# Patient Record
Sex: Female | Born: 1960 | Race: Black or African American | Hispanic: No | Marital: Single | Smoking: Former smoker
Health system: Southern US, Community
[De-identification: ages and names within clinical notes are randomized; demographics above are authoritative.]

## PROBLEM LIST (undated history)

## (undated) DIAGNOSIS — Z90710 Acquired absence of both cervix and uterus: Secondary | ICD-10-CM

## (undated) DIAGNOSIS — G4700 Insomnia, unspecified: Secondary | ICD-10-CM

## (undated) DIAGNOSIS — J45909 Unspecified asthma, uncomplicated: Secondary | ICD-10-CM

## (undated) DIAGNOSIS — R002 Palpitations: Secondary | ICD-10-CM

## (undated) DIAGNOSIS — C539 Malignant neoplasm of cervix uteri, unspecified: Secondary | ICD-10-CM

## (undated) DIAGNOSIS — R202 Paresthesia of skin: Secondary | ICD-10-CM

## (undated) DIAGNOSIS — T7840XA Allergy, unspecified, initial encounter: Secondary | ICD-10-CM

## (undated) DIAGNOSIS — R5383 Other fatigue: Secondary | ICD-10-CM

## (undated) DIAGNOSIS — G4730 Sleep apnea, unspecified: Secondary | ICD-10-CM

## (undated) DIAGNOSIS — Z5189 Encounter for other specified aftercare: Secondary | ICD-10-CM

## (undated) DIAGNOSIS — I10 Essential (primary) hypertension: Secondary | ICD-10-CM

## (undated) HISTORY — DX: Other fatigue: R53.83

## (undated) HISTORY — PX: UTERINE FIBROID SURGERY: SHX826

## (undated) HISTORY — PX: INGUINAL HERNIA REPAIR: SUR1180

## (undated) HISTORY — PX: ABDOMINAL HYSTERECTOMY: SHX81

## (undated) HISTORY — PX: CERVICAL ABLATION: SHX5771

## (undated) HISTORY — DX: Malignant neoplasm of cervix uteri, unspecified: C53.9

## (undated) HISTORY — DX: Sleep apnea, unspecified: G47.30

## (undated) HISTORY — DX: Palpitations: R00.2

## (undated) HISTORY — DX: Encounter for other specified aftercare: Z51.89

## (undated) HISTORY — DX: Essential (primary) hypertension: I10

## (undated) HISTORY — DX: Insomnia, unspecified: G47.00

## (undated) HISTORY — DX: Paresthesia of skin: R20.2

## (undated) HISTORY — DX: Allergy, unspecified, initial encounter: T78.40XA

## (undated) HISTORY — DX: Acquired absence of both cervix and uterus: Z90.710

---

## 1997-10-10 ENCOUNTER — Other Ambulatory Visit: Admission: RE | Admit: 1997-10-10 | Discharge: 1997-10-10 | Payer: Self-pay | Admitting: Dermatology

## 1999-03-14 ENCOUNTER — Other Ambulatory Visit: Admission: RE | Admit: 1999-03-14 | Discharge: 1999-03-14 | Payer: Self-pay | Admitting: *Deleted

## 1999-10-21 ENCOUNTER — Encounter: Admission: RE | Admit: 1999-10-21 | Discharge: 1999-10-21 | Payer: Self-pay | Admitting: *Deleted

## 1999-10-21 ENCOUNTER — Encounter: Payer: Self-pay | Admitting: *Deleted

## 2000-06-14 ENCOUNTER — Other Ambulatory Visit: Admission: RE | Admit: 2000-06-14 | Discharge: 2000-06-14 | Payer: Self-pay | Admitting: *Deleted

## 2000-12-23 ENCOUNTER — Encounter: Payer: Self-pay | Admitting: *Deleted

## 2000-12-23 ENCOUNTER — Encounter: Admission: RE | Admit: 2000-12-23 | Discharge: 2000-12-23 | Payer: Self-pay | Admitting: *Deleted

## 2002-01-27 ENCOUNTER — Other Ambulatory Visit: Admission: RE | Admit: 2002-01-27 | Discharge: 2002-01-27 | Payer: Self-pay | Admitting: Family Medicine

## 2002-10-10 ENCOUNTER — Encounter: Payer: Self-pay | Admitting: Family Medicine

## 2002-10-10 ENCOUNTER — Encounter: Admission: RE | Admit: 2002-10-10 | Discharge: 2002-10-10 | Payer: Self-pay | Admitting: Family Medicine

## 2003-07-31 ENCOUNTER — Ambulatory Visit (HOSPITAL_COMMUNITY): Admission: RE | Admit: 2003-07-31 | Discharge: 2003-07-31 | Payer: Self-pay | Admitting: Family Medicine

## 2003-08-02 ENCOUNTER — Inpatient Hospital Stay (HOSPITAL_COMMUNITY): Admission: AD | Admit: 2003-08-02 | Discharge: 2003-08-02 | Payer: Self-pay | Admitting: Obstetrics and Gynecology

## 2003-08-08 ENCOUNTER — Inpatient Hospital Stay (HOSPITAL_COMMUNITY): Admission: AD | Admit: 2003-08-08 | Discharge: 2003-08-10 | Payer: Self-pay | Admitting: Obstetrics and Gynecology

## 2003-11-27 ENCOUNTER — Other Ambulatory Visit: Admission: RE | Admit: 2003-11-27 | Discharge: 2003-11-27 | Payer: Self-pay | Admitting: Obstetrics and Gynecology

## 2003-11-29 ENCOUNTER — Ambulatory Visit (HOSPITAL_COMMUNITY): Admission: RE | Admit: 2003-11-29 | Discharge: 2003-11-29 | Payer: Self-pay | Admitting: Obstetrics and Gynecology

## 2003-12-05 ENCOUNTER — Inpatient Hospital Stay (HOSPITAL_COMMUNITY): Admission: RE | Admit: 2003-12-05 | Discharge: 2003-12-08 | Payer: Self-pay | Admitting: Obstetrics and Gynecology

## 2003-12-14 ENCOUNTER — Inpatient Hospital Stay (HOSPITAL_COMMUNITY): Admission: AD | Admit: 2003-12-14 | Discharge: 2003-12-14 | Payer: Self-pay | Admitting: Obstetrics and Gynecology

## 2004-07-21 ENCOUNTER — Emergency Department (HOSPITAL_COMMUNITY): Admission: EM | Admit: 2004-07-21 | Discharge: 2004-07-21 | Payer: Self-pay | Admitting: Emergency Medicine

## 2005-03-03 ENCOUNTER — Other Ambulatory Visit: Admission: RE | Admit: 2005-03-03 | Discharge: 2005-03-03 | Payer: Self-pay | Admitting: Obstetrics and Gynecology

## 2005-04-17 ENCOUNTER — Encounter: Admission: RE | Admit: 2005-04-17 | Discharge: 2005-04-17 | Payer: Self-pay | Admitting: Obstetrics and Gynecology

## 2005-08-03 ENCOUNTER — Emergency Department (HOSPITAL_COMMUNITY): Admission: EM | Admit: 2005-08-03 | Discharge: 2005-08-03 | Payer: Self-pay | Admitting: Emergency Medicine

## 2005-08-06 ENCOUNTER — Encounter: Admission: RE | Admit: 2005-08-06 | Discharge: 2005-08-06 | Payer: Self-pay | Admitting: Family Medicine

## 2006-06-17 ENCOUNTER — Encounter: Admission: RE | Admit: 2006-06-17 | Discharge: 2006-06-17 | Payer: Self-pay | Admitting: Family Medicine

## 2010-11-13 ENCOUNTER — Other Ambulatory Visit (HOSPITAL_COMMUNITY): Payer: Self-pay | Admitting: Family Medicine

## 2010-11-13 DIAGNOSIS — Z780 Asymptomatic menopausal state: Secondary | ICD-10-CM

## 2010-11-13 DIAGNOSIS — Z1231 Encounter for screening mammogram for malignant neoplasm of breast: Secondary | ICD-10-CM

## 2010-11-28 ENCOUNTER — Ambulatory Visit (HOSPITAL_COMMUNITY)
Admission: RE | Admit: 2010-11-28 | Discharge: 2010-11-28 | Disposition: A | Discharge status: Home / Self Care | Payer: Self-pay | Source: Ambulatory Visit | Attending: Family Medicine | Admitting: Family Medicine

## 2010-11-28 DIAGNOSIS — Z1231 Encounter for screening mammogram for malignant neoplasm of breast: Secondary | ICD-10-CM | POA: Insufficient documentation

## 2010-11-28 DIAGNOSIS — Z780 Asymptomatic menopausal state: Secondary | ICD-10-CM

## 2010-11-28 DIAGNOSIS — Z13820 Encounter for screening for osteoporosis: Secondary | ICD-10-CM | POA: Insufficient documentation

## 2010-12-24 ENCOUNTER — Other Ambulatory Visit: Payer: Self-pay | Admitting: Family Medicine

## 2010-12-24 ENCOUNTER — Encounter: Payer: Self-pay | Admitting: Internal Medicine

## 2011-01-08 ENCOUNTER — Encounter: Payer: Self-pay | Admitting: Internal Medicine

## 2011-01-09 ENCOUNTER — Encounter: Payer: Self-pay | Admitting: Internal Medicine

## 2011-01-09 ENCOUNTER — Ambulatory Visit (INDEPENDENT_AMBULATORY_CARE_PROVIDER_SITE_OTHER): Payer: Self-pay | Admitting: Internal Medicine

## 2011-01-09 DIAGNOSIS — R9431 Abnormal electrocardiogram [ECG] [EKG]: Secondary | ICD-10-CM

## 2011-01-09 DIAGNOSIS — I10 Essential (primary) hypertension: Secondary | ICD-10-CM

## 2011-01-09 DIAGNOSIS — Z0000 Encounter for general adult medical examination without abnormal findings: Secondary | ICD-10-CM

## 2011-01-09 NOTE — Progress Notes (Signed)
HPI [facility].  Working on BP.  Referred for abnormal EKG No symptoms of chest pain.  Just started exercising.  Walking.  Has machine to work abs. Was swimming  All summer.No cutting back on work outs. Occasional palpitations.  Couple seconds.  No caffeine.  No dizziness. Smoking  Allergies  Allergen Reactions  . Influenza Virus Vaccine   . Penicillins     Current Outpatient Prescriptions  Medication Sig Dispense Refill  . metroNIDAZOLE (FLAGYL) 500 MG tablet Take 500 mg by mouth 3 (three) times daily.          Past Medical History  Diagnosis Date  . Chest pain   . Palpitations   . Insomnia   . Fatigue   . Paresthesias     lower extremities  . H/O: hysterectomy     Past Surgical History  Procedure Date  . Inguinal hernia repair   . Uterine fibroid surgery     Family History  Problem Relation Age of Onset  . Hypertension Mother   . Diabetes Mother   . Osteoarthritis Mother   . Stroke Maternal Grandmother     History   Social History  . Marital Status: Divorced    Spouse Name: N/A    Number of Children: N/A  . Years of Education: N/A   Occupational History  . Not on file.   Social History Main Topics  . Smoking status: Current Everyday Smoker -- 0.5 packs/day    Types: Cigarettes  . Smokeless tobacco: Not on file  . Alcohol Use: Yes     < 6 drinks weekly  . Drug Use: Not on file  . Sexually Active: Not on file   Other Topics Concern  . Not on file   Social History Narrative  . No narrative on file    Review of Systems:  All systems reviewed.  They are negative to the above problem except as previously stated.  Vital Signs: BP 121/83  Pulse 90  Ht 5\' 6"  (1.676 m)  Wt 175 lb (79.379 kg)  BMI 28.25 kg/m2  Physical Exam Patient is in NAD HEENT:  Normocephalic, atraumatic. EOMI, PERRLA.  Neck: JVP is normal. No thyromegaly. No bruits.  Lungs: clear to auscultation. No rales no wheezes.  Heart: Regular rate and rhythm.  Normal S1, S2. No S3.   No significant murmurs. PMI not displaced.  Abdomen:  Supple, nontender. Normal bowel sounds. No masses. No hepatomegaly.  Extremities:   Good distal pulses throughout. No lower extremity edema.  Musculoskeletal :moving all extremities.  Neuro:   alert and oriented x3.  CN II-XII grossly intact.  EKG:  ([facility], 12/24/10);  SR  Nonspecific ST T wave changes.   Assessment and Plan:

## 2011-01-12 DIAGNOSIS — R9431 Abnormal electrocardiogram [ECG] [EKG]: Secondary | ICD-10-CM | POA: Insufficient documentation

## 2011-01-12 DIAGNOSIS — Z0000 Encounter for general adult medical examination without abnormal findings: Secondary | ICD-10-CM | POA: Insufficient documentation

## 2011-01-12 DIAGNOSIS — I10 Essential (primary) hypertension: Secondary | ICD-10-CM | POA: Insufficient documentation

## 2011-01-12 NOTE — Assessment & Plan Note (Signed)
GOod control.

## 2011-01-12 NOTE — Assessment & Plan Note (Signed)
Counselled on tobacco.  Went over lipid goals.  She has levels checked at [facility].

## 2011-01-12 NOTE — Assessment & Plan Note (Signed)
I am not impressed with a signif abnormality on the EKG.  Patient denies symptoms.  EKG changes are nonspecific. I would not pursue any further work up.

## 2013-01-22 ENCOUNTER — Encounter (HOSPITAL_COMMUNITY): Payer: Self-pay | Admitting: Emergency Medicine

## 2013-01-22 ENCOUNTER — Emergency Department (INDEPENDENT_AMBULATORY_CARE_PROVIDER_SITE_OTHER)
Admission: EM | Admit: 2013-01-22 | Discharge: 2013-01-22 | Disposition: A | Discharge status: Home / Self Care | Payer: No Typology Code available for payment source | Source: Home / Self Care | Attending: Family Medicine | Admitting: Family Medicine

## 2013-01-22 DIAGNOSIS — M25569 Pain in unspecified knee: Secondary | ICD-10-CM

## 2013-01-22 DIAGNOSIS — M25561 Pain in right knee: Secondary | ICD-10-CM

## 2013-01-22 MED ORDER — METHYLPREDNISOLONE ACETATE 40 MG/ML IJ SUSP
INTRAMUSCULAR | Status: AC
  Filled 2013-01-22: qty 5

## 2013-01-22 MED ORDER — METHYLPREDNISOLONE ACETATE 40 MG/ML IJ SUSP
40.0000 mg | Freq: Once | INTRAMUSCULAR | Status: AC
  Administered 2013-01-22: 40 mg via INTRA_ARTICULAR

## 2013-01-22 MED ORDER — TRAMADOL HCL 50 MG PO TABS: 50.0000 mg | ORAL_TABLET | Freq: Four times a day (QID) | ORAL | Status: DC | PRN

## 2013-01-22 NOTE — ED Notes (Signed)
C/o bilateral knee pain States right knee is swollen and then the swelling will go to the other knee.   Denies any injury.

## 2013-01-22 NOTE — ED Provider Notes (Signed)
Karen Molina is a 52 y.o. female who presents to Urgent Care today for bilateral knee pain right worse than left. Patient has had knee pain off and on bilaterally for many years. Her pain is slowly worsening. She notes that her right knee pain is worsening now after doing more walking than usual on Halloween evening. She denies any injury locking or catching. She's taking ibuprofen which has helped a little. She denies any radiating pain weakness or numbness.   Past Medical History  Diagnosis Date  . Chest pain   . Palpitations   . Insomnia   . Fatigue   . Paresthesias     lower extremities  . H/O: hysterectomy    History  Substance Use Topics  . Smoking status: Current Every Day Smoker -- 0.50 packs/day    Types: Cigarettes  . Smokeless tobacco: Not on file  . Alcohol Use: Yes     Comment: < 6 drinks weekly   ROS as above Medications reviewed. No current facility-administered medications for this encounter.   Current Outpatient Prescriptions  Medication Sig Dispense Refill  . traMADol (ULTRAM) 50 MG tablet Take 1 tablet (50 mg total) by mouth every 6 (six) hours as needed for pain.  40 tablet  0    Exam:  BP 160/103  Pulse 88  Temp(Src) 97.8 F (36.6 C) (Oral)  Resp 18  SpO2 99% Gen: Well NAD KNEES BILATERALLY:  Right: No significant effusion. Mildly tender over the medial joint line. Range of motion 0-120 with 1+ retropatellar crepitations on extension. Stable ligamentous exam.  Left: No significant effusion. Mildly tender over the medial joint line. Range of motion 0-120 with 1+ retropatellar crepitations on extension. Stable ligamentous exam.  Nonedematous bilateral extremities with normal capillary refill and sensation.  Knee injection. Right Consent obtained and timeout performed. Patient seated in a comfortable position with legs hanging off the table.  The medial Peri-patellar tendon space was palpated and marked. The skin was then cleaned with  Alchol. Cold spray applied. A 25-gauge inch and a half needle was used to inject 40 mg of Depo-Medrol and 4 mL of 0.5% Marcaine. Patient tolerated procedure well no bleeding Pain improved following injection    No results found for this or any previous visit (from the past 24 hour(s)). No results found.  Assessment and Plan: 52 y.o. female with knee pain. Likely due to DJD. Plan to treat with corticosteroid injection and tramadol. If not improved followup with [facility] for ultimate referral to  [facility]. Discussed warning signs or symptoms. Please see discharge instructions. Patient expresses understanding.   Rodolph Bong, MD 01/22/13 1500

## 2013-01-31 ENCOUNTER — Ambulatory Visit: Payer: No Typology Code available for payment source | Attending: Internal Medicine

## 2013-02-14 ENCOUNTER — Ambulatory Visit: Payer: No Typology Code available for payment source | Attending: Internal Medicine | Admitting: Internal Medicine

## 2013-02-14 ENCOUNTER — Encounter: Payer: Self-pay | Admitting: Internal Medicine

## 2013-02-14 VITALS — BP 157/97 | HR 87 | Temp 98.2°F | Resp 16 | Ht 66.0 in | Wt 189.6 lb

## 2013-02-14 DIAGNOSIS — M25561 Pain in right knee: Secondary | ICD-10-CM

## 2013-02-14 DIAGNOSIS — R030 Elevated blood-pressure reading, without diagnosis of hypertension: Secondary | ICD-10-CM

## 2013-02-14 DIAGNOSIS — M25569 Pain in unspecified knee: Secondary | ICD-10-CM | POA: Insufficient documentation

## 2013-02-14 DIAGNOSIS — Z139 Encounter for screening, unspecified: Secondary | ICD-10-CM

## 2013-02-14 LAB — CBC WITH DIFFERENTIAL/PLATELET
Basophils Absolute: 0 10*3/uL (ref 0.0–0.1)
Eosinophils Relative: 1 % (ref 0–5)
HCT: 35.3 % — ABNORMAL LOW (ref 36.0–46.0)
Lymphocytes Relative: 35 % (ref 12–46)
Lymphs Abs: 2.2 10*3/uL (ref 0.7–4.0)
MCHC: 32.3 g/dL (ref 30.0–36.0)
Monocytes Absolute: 0.4 10*3/uL (ref 0.1–1.0)
Monocytes Relative: 6 % (ref 3–12)
Platelets: 280 10*3/uL (ref 150–400)
RDW: 17.1 % — ABNORMAL HIGH (ref 11.5–15.5)
WBC: 6.5 10*3/uL (ref 4.0–10.5)

## 2013-02-14 MED ORDER — IBUPROFEN 800 MG PO TABS: 800.0000 mg | ORAL_TABLET | Freq: Three times a day (TID) | ORAL | Status: DC | PRN

## 2013-02-14 NOTE — Patient Instructions (Signed)

## 2013-02-14 NOTE — Progress Notes (Signed)
Patient here for bilateral knee pain Recently seen at the urgent care and had  Cortisone shot to right knee

## 2013-02-14 NOTE — Progress Notes (Signed)
Patient ID: Karen Molina, female   DOB: 1961/01/09, 52 y.o.   MRN: [id number] MRN: [id number] Name: Karen Molina  Sex: female Age: 52 y.o. DOB: 18-Nov-1960  Allergies: Influenza virus vacc split pf and Penicillins  Chief Complaint  Patient presents with   Knee Pain    HPI: Patient is 52 y.o. female who comes for the first time to establish medical care, she reported to have bilateral knee pain, she went to the urgent care and was given a steroid injection in right knee , As per patient it is improved but still has more pain in the left knee, today her blood pressure is elevated, denies any headache dizziness chest pain or shortness of breath.   Past Medical History  Diagnosis Date   Chest pain    Palpitations    Insomnia    Fatigue    Paresthesias     lower extremities   H/O: hysterectomy     Past Surgical History  Procedure Laterality Date   Inguinal hernia repair     Uterine fibroid surgery     Abdominal hysterectomy        Medication List       This list is accurate as of: 02/14/13  5:00 PM.  Always use your most recent med list.               ibuprofen 800 MG tablet  Commonly known as:  ADVIL,MOTRIN  Take 1 tablet (800 mg total) by mouth every 8 (eight) hours as needed.        Meds ordered this encounter  Medications   ibuprofen (ADVIL,MOTRIN) 800 MG tablet    Sig: Take 1 tablet (800 mg total) by mouth every 8 (eight) hours as needed.    Dispense:  30 tablet    Refill:  1     There is no immunization history on file for this patient.  History  Substance Use Topics   Smoking status: Never Smoker    Smokeless tobacco: Not on file   Alcohol Use: Yes     Comment: < 6 drinks weekly    Review of Systems  As noted in HPI  Filed Vitals:   02/14/13 1632  BP: 157/97  Pulse: 87  Temp: 98.2 F (36.8 C)  Resp: 16    Physical Exam  Physical Exam  Constitutional: She is oriented to person, place, and time. No distress.  Eyes:  EOM are normal. Pupils are equal, round, and reactive to light.  Cardiovascular: Normal rate and regular rhythm.   Pulmonary/Chest: Breath sounds normal. No respiratory distress. She has no wheezes. She has no rales.  Musculoskeletal:  Left knee tenderness on the medial side   Neurological: She is alert and oriented to person, place, and time.    CBC No results found for this basename: wbc, rbc, hgb, hct, plt, mcv, neutrabs, lymphsabs, monoabs, eosabs, basosabs    CMP  No results found for this basename: na, k, cl, co2, glucose, bun, creatinine, calcium, prot, albumin, ast, alt, alkphos, bilitot, gfrnonaa, gfraa    No results found for this basename: chol, tri, ldl    No components found with this basename: hga1c    No results found for this basename: AST    Assessment and Plan  Elevated BP  Advised for low salt diet and exercise   Bilateral knee pain - Plan: ibuprofen (ADVIL,MOTRIN) 800 MG tablet, Ambulatory referral to Orthopedic Surgery  Screening - Plan: CBC with Differential, COMPLETE METABOLIC PANEL WITH  GFR, TSH, Lipid panel, Vit D  25 hydroxy (rtn osteoporosis monitoring), Ambulatory referral to Gynecology   Return in about 6 weeks (around 03/28/2013).  Doris Cheadle, MD

## 2013-02-15 LAB — LIPID PANEL
Cholesterol: 207 mg/dL — ABNORMAL HIGH (ref 0–200)
HDL: 46 mg/dL (ref >39)
Total CHOL/HDL Ratio: 4.5 Ratio
Triglycerides: 218 mg/dL — ABNORMAL HIGH (ref <150)

## 2013-02-15 LAB — COMPLETE METABOLIC PANEL WITH GFR
ALT: 19 U/L (ref 0–35)
AST: 18 U/L (ref 0–37)
Creat: 0.71 mg/dL (ref 0.50–1.10)
GFR, Est African American: >89 mL/min
GFR, Est Non African American: >89 mL/min
Potassium: 4.3 mEq/L (ref 3.5–5.3)

## 2013-02-15 LAB — VITAMIN D 25 HYDROXY (VIT D DEFICIENCY, FRACTURES): Vit D, 25-Hydroxy: 11 ng/mL — ABNORMAL LOW (ref 30–89)

## 2013-03-20 ENCOUNTER — Ambulatory Visit (INDEPENDENT_AMBULATORY_CARE_PROVIDER_SITE_OTHER): Payer: No Typology Code available for payment source | Admitting: Sports Medicine

## 2013-03-20 ENCOUNTER — Encounter: Payer: Self-pay | Admitting: Sports Medicine

## 2013-03-20 VITALS — BP 144/84 | HR 89 | Ht 66.0 in | Wt 189.0 lb

## 2013-03-20 DIAGNOSIS — M25569 Pain in unspecified knee: Secondary | ICD-10-CM

## 2013-03-20 DIAGNOSIS — M25562 Pain in left knee: Secondary | ICD-10-CM

## 2013-03-20 MED ORDER — METHYLPREDNISOLONE ACETATE 40 MG/ML IJ SUSP
40.0000 mg | Freq: Once | INTRAMUSCULAR | Status: AC
  Administered 2013-03-20: 40 mg via INTRA_ARTICULAR

## 2013-03-20 NOTE — Progress Notes (Signed)
Subjective:    Patient ID: Karen Molina, female    DOB: 1960/03/31, 52 y.o.   MRN: [id number]  HPI 52 year old female with HTN presents for evaluation of Bilateral Knee pain.  Patient has had bilateral knee pain for approximately one year.  This began after she started walking on a daily basis.  She reports that the pain is located diffusely over her knee.  No focal area of tenderness.  She reports that the pain is intermittent and worsens with physical activity and prolonged standing.  She also notes that she has frequent popping/clicking with motion.    She has been seen for this on a few prior occasions.  In November of 2014 she was evaluated at urgent care by Dr. Denyse Amass.  Pain was thought to be secondary to DJD and patient received a steroid injection of the right knee with dramatic improvement.  She is also seeing her private care physician and has been taking PRN ibuprofen for pain. It has helped but  has increased her blood pressure and she was forced to stop. Tramadol was also prescribed which was ineffective.  Recently she's been taking Tylenol with minimal improvement in her pain.  History reviewed. See below.  Past Medical History  Diagnosis Date  . Chest pain   . Palpitations   . Insomnia   . Fatigue   . Paresthesias     lower extremities  . H/O: hysterectomy    Past Surgical History  Procedure Laterality Date  . Inguinal hernia repair    . Uterine fibroid surgery    . Abdominal hysterectomy     Patient Active Problem List   Diagnosis Date Noted  . Abnormal EKG 01/12/2011  . Hypertension 01/12/2011  . Healthcare maintenance 01/12/2011   History   Social History  . Marital Status: Divorced    Spouse Name: N/A    Number of Children: N/A  . Years of Education: N/A   Social History Main Topics  . Smoking status: Never Smoker   . Smokeless tobacco: None  . Alcohol Use: Yes     Comment: < 6 drinks weekly  . Drug Use: No  . Sexual Activity: None   Other  Topics Concern  . None   Social History Narrative  . None   Review of Systems Per HPI with the following additions: No recent fall, trauma.      Objective:   Physical Exam Filed Vitals:   03/20/13 1543  BP: 144/84  Pulse: 89  General: Well-appearing African female in no acute distress. Knee: Inspection: Normal (right knee). Small effusion of the left knee noted.  Palpation: normal with no warmth, joint line tenderness, patellar tenderness, or condyle tenderness. ROM full in flexion and extension and lower leg rotation. Crepitus noted with seated flexion and extension. Ligaments with solid consistent endpoints including ACL, PCL, LCL, MCL. Negative Mcmurray's.   Non painful patellar compression. Patellar and quadriceps tendons unremarkable. Hamstring and quadriceps strength is normal.  Normal Gait.  Procedure: Left knee injection Consent signed and scanned into record. Medication:  1 cc Solumedrol  3cc Lidocaine 1% Preparation: area cleansed with alcohol x 3 Time Out taken  Injection  Landmarks identified About medication injected using an anterior medial approach Patient tolerated well without bleeding or paresthesias  Patient had good range of motion of joint after injection     Assessment & Plan:  Bilateral knee pain  - Right knee pain now resolved following recent steroid injection. - Patient with  significant left knee pain and crepitus.  This is likely secondary to DJD. - Given elevated BP with NSAIDs and improvement with prior injection, patient was given L knee injection today.  Patient tolerated procedure well. - Patient to follow up as needed.  If fails to improve will need standing xray's to evaluate degree of DJD.  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 I will give her some soft green sports insoles to try her work shoes.

## 2013-03-28 ENCOUNTER — Ambulatory Visit: Payer: No Typology Code available for payment source | Admitting: Internal Medicine

## 2013-03-28 ENCOUNTER — Telehealth: Payer: Self-pay

## 2013-04-17 ENCOUNTER — Encounter: Payer: No Typology Code available for payment source | Admitting: Obstetrics & Gynecology

## 2013-05-08 ENCOUNTER — Ambulatory Visit: Payer: No Typology Code available for payment source | Admitting: Family Medicine

## 2013-05-16 ENCOUNTER — Ambulatory Visit: Payer: No Typology Code available for payment source | Admitting: Sports Medicine

## 2013-05-29 ENCOUNTER — Ambulatory Visit: Payer: No Typology Code available for payment source | Admitting: Sports Medicine

## 2013-07-05 ENCOUNTER — Encounter: Payer: Self-pay | Admitting: Obstetrics & Gynecology

## 2013-07-05 ENCOUNTER — Ambulatory Visit (INDEPENDENT_AMBULATORY_CARE_PROVIDER_SITE_OTHER): Payer: No Typology Code available for payment source | Admitting: Obstetrics & Gynecology

## 2013-07-05 VITALS — BP 131/91 | HR 87 | Temp 97.6°F | Ht 67.0 in | Wt 190.6 lb

## 2013-07-05 DIAGNOSIS — N951 Menopausal and female climacteric states: Secondary | ICD-10-CM

## 2013-07-05 DIAGNOSIS — Z0000 Encounter for general adult medical examination without abnormal findings: Secondary | ICD-10-CM

## 2013-07-05 LAB — COMPREHENSIVE METABOLIC PANEL
ALBUMIN: 4.5 g/dL (ref 3.5–5.2)
ALT: 21 U/L (ref 0–35)
AST: 19 U/L (ref 0–37)
Alkaline Phosphatase: 52 U/L (ref 39–117)
BUN: 16 mg/dL (ref 6–23)
CALCIUM: 9.8 mg/dL (ref 8.4–10.5)
CHLORIDE: 103 meq/L (ref 96–112)
CO2: 27 meq/L (ref 19–32)
Creat: 0.71 mg/dL (ref 0.50–1.10)
GLUCOSE: 104 mg/dL — AB (ref 70–99)
POTASSIUM: 4.5 meq/L (ref 3.5–5.3)
Sodium: 137 mEq/L (ref 135–145)
Total Bilirubin: 0.5 mg/dL (ref 0.2–1.2)
Total Protein: 7.3 g/dL (ref 6.0–8.3)

## 2013-07-05 LAB — LIPID PANEL
CHOLESTEROL: 244 mg/dL — AB (ref 0–200)
HDL: 46 mg/dL (ref >39)
LDL Cholesterol: 158 mg/dL — ABNORMAL HIGH (ref 0–99)
TRIGLYCERIDES: 200 mg/dL — AB (ref <150)
Total CHOL/HDL Ratio: 5.3 Ratio
VLDL: 40 mg/dL (ref 0–40)

## 2013-07-05 LAB — CBC
HEMATOCRIT: 37.4 % (ref 36.0–46.0)
HEMOGLOBIN: 12.3 g/dL (ref 12.0–15.0)
MCH: 23.5 pg — ABNORMAL LOW (ref 26.0–34.0)
MCHC: 32.9 g/dL (ref 30.0–36.0)
MCV: 71.5 fL — ABNORMAL LOW (ref 78.0–100.0)
Platelets: 303 10*3/uL (ref 150–400)
RBC: 5.23 MIL/uL — ABNORMAL HIGH (ref 3.87–5.11)
RDW: 17.6 % — AB (ref 11.5–15.5)
WBC: 6.6 10*3/uL (ref 4.0–10.5)

## 2013-07-05 MED ORDER — ESTROGENS CONJ SYNTHETIC B 1.25 MG PO TABS: 1.2500 mg | ORAL_TABLET | Freq: Every day | ORAL | Status: DC

## 2013-07-05 NOTE — Progress Notes (Signed)
Subjective:    Karen Molina is a 53 y.o. female who presents for an annual exam. The patient has no complaints today. She has hot flashes and night sweats for 9 years. She has mood changes and wants treatment. The patient is sexually active. GYN screening history: last pap: was normal. The patient wears seatbelts: yes. The patient participates in regular exercise: yes. Has the patient ever been transfused or tattooed?: no. The patient reports that there is not domestic violence in her life.   Menstrual History: OB History   Grav Para Term Preterm Abortions TAB SAB Ect Mult Living   3 3        2       Menarche age: 45  No LMP recorded. Patient has had a hysterectomy.    The following portions of the patient's history were reviewed and updated as appropriate: allergies, current medications, past family history, past medical history, past social history, past surgical history and problem list.  Review of Systems Pertinent items are noted in HPI.  Monogamous for 5 years. 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 Denies dyspareunia, buts need lubrication with sex   Objective:    BP 131/91  Pulse 87  Temp(Src) 97.6 F (36.4 C)  Ht 5\' 7"  (1.702 m)  Wt 190 lb 9.6 oz (86.456 kg)  BMI 29.85 kg/m2  General Appearance:    Alert, cooperative, no distress, appears stated age  Head:    Normocephalic, without obvious abnormality, atraumatic  Eyes:    PERRL, conjunctiva/corneas clear, EOM's intact, fundi    benign, both eyes  Ears:    Normal TM's and external ear canals, both ears  Nose:   Nares normal, septum midline, mucosa normal, no drainage    or sinus tenderness  Throat:   Lips, mucosa, and tongue normal; teeth and gums normal  Neck:   Supple, symmetrical, trachea midline, no adenopathy;    thyroid:  no enlargement/tenderness/nodules; no carotid   bruit or JVD  Back:     Symmetric, no curvature, ROM normal, no CVA tenderness  Lungs:     Clear to auscultation  bilaterally, respirations unlabored  Chest Wall:    No tenderness or deformity   Heart:    Regular rate and rhythm, S1 and S2 normal, no murmur, rub   or gallop  Breast Exam:    No tenderness, masses, or nipple abnormality  Abdomen:     Soft, non-tender, bowel sounds active all four quadrants,    no masses, no organomegaly  Genitalia:    Normal female without lesion, discharge or tenderness, normal bimanual exam     Extremities:   Extremities normal, atraumatic, no cyanosis or edema  Pulses:   2+ and symmetric all extremities  Skin:   Skin color, texture, turgor normal, no rashes or lesions  Lymph nodes:   Cervical, supraclavicular, and axillary nodes normal  Neurologic:   CNII-XII intact, normal strength, sensation and reflexes    throughout  .    Assessment:    Healthy female exam.  Menopausal symptoms definitely affecting her life   Plan:     Breast self exam technique reviewed and patient encouraged to perform self-exam monthly. Mammogram.  Start 1.25 mg estrogen daily RTC 1 month Fasting labs today

## 2013-07-06 ENCOUNTER — Telehealth: Payer: Self-pay

## 2013-07-06 MED ORDER — ESTRADIOL 1 MG PO TABS: 1.0000 mg | ORAL_TABLET | Freq: Every day | ORAL | Status: DC

## 2013-07-06 NOTE — Telephone Encounter (Signed)
Estrace 1mg  PO daily prescribed after speaking to Dr. Florencia Reasons about medication options for generic estrogens. [place] who stated medication would be OK as it will be $4. Medication will be ready for patient tomorrow.

## 2013-07-06 NOTE — Telephone Encounter (Signed)
Message copied by Geanie Logan on Thu Jul 06, 2013  1:28 PM ------      Message from: Clovia Cuff C      Created: Thu Jul 06, 2013 10:25 AM       Any of the generic estrogens are appropriate. I thought that I had written generic. Premarin is fine also. I am thinking that at least one of the generic oral estrogens is on the $4 list at [facility] or [place]. If you get me the list, I can show you which one is fine.      Thanks      ----- Message -----         From: Geanie Logan, RN         Sent: 07/06/2013   9:47 AM           To: Emily Filbert, MD            Hi Dr. Hulan Fray,             [facility] called and said they and pt. Cannot afford the Enjuvia and would like to know if they can prescribe Premarin instead (however, pt. Will not be able to get the Premarin for a Month because they need to have pt. Apply and if accepted will get Premarin for free). Other option: is there another medication comparable that is much cheaper. Enjuvia is $250 and pt. Has [place] card.             Please let us know so I can call the pharmacy.             Thanks so much,       Lovena Le, Therapist, sports        ------

## 2013-07-06 NOTE — Telephone Encounter (Signed)
Pt's [facility] called [phone]) and stated the Enjuvia prescribed is $250-- they would like to know if pt. Can take Premarin (though it will be up to a month until pt. Can get it because there is an application process and pt. Will only get it for free if approved). If not, Dr. Hulan Fray needs to recommend another medication. Message sent to Dr. Hulan Fray regarding options, awaiting response.

## 2013-07-17 ENCOUNTER — Telehealth: Payer: Self-pay

## 2013-07-17 ENCOUNTER — Encounter: Payer: Self-pay | Admitting: *Deleted

## 2013-07-17 NOTE — Telephone Encounter (Signed)
Called pt and left message to give [place] a call as needed or in one year to schedule an annual exam.

## 2013-07-17 NOTE — Telephone Encounter (Signed)
Message copied by Michel Harrow on Mon Jul 17, 2013  3:58 PM ------      Message from: Emily Filbert      Created: Mon Jul 17, 2013  3:45 PM       Just in a year for an annual ------

## 2013-07-17 NOTE — Telephone Encounter (Signed)
Opened in Error.

## 2013-08-04 ENCOUNTER — Other Ambulatory Visit: Payer: Self-pay | Admitting: Obstetrics & Gynecology

## 2013-08-04 DIAGNOSIS — Z1231 Encounter for screening mammogram for malignant neoplasm of breast: Secondary | ICD-10-CM

## 2013-08-09 ENCOUNTER — Ambulatory Visit: Payer: Self-pay | Admitting: Obstetrics & Gynecology

## 2013-08-09 ENCOUNTER — Ambulatory Visit (HOSPITAL_COMMUNITY): Payer: Self-pay

## 2013-10-25 ENCOUNTER — Ambulatory Visit (HOSPITAL_COMMUNITY)
Admission: RE | Admit: 2013-10-25 | Discharge: 2013-10-25 | Disposition: A | Discharge status: Home / Self Care | Payer: BC Managed Care – PPO | Source: Ambulatory Visit | Attending: Obstetrics & Gynecology | Admitting: Obstetrics & Gynecology

## 2013-10-25 DIAGNOSIS — Z1231 Encounter for screening mammogram for malignant neoplasm of breast: Secondary | ICD-10-CM

## 2014-01-22 ENCOUNTER — Encounter: Payer: Self-pay | Admitting: Obstetrics & Gynecology

## 2014-07-02 ENCOUNTER — Encounter (HOSPITAL_COMMUNITY): Payer: Self-pay | Admitting: *Deleted

## 2014-07-02 ENCOUNTER — Emergency Department (HOSPITAL_COMMUNITY)
Admission: EM | Admit: 2014-07-02 | Discharge: 2014-07-02 | Disposition: A | Discharge status: Hospice | Payer: Managed Care, Other (non HMO) | Source: Home / Self Care | Attending: Emergency Medicine | Admitting: Emergency Medicine

## 2014-07-02 ENCOUNTER — Emergency Department (HOSPITAL_COMMUNITY)
Admission: EM | Admit: 2014-07-02 | Discharge: 2014-07-02 | Disposition: A | Discharge status: Home / Self Care | Payer: Managed Care, Other (non HMO) | Attending: Emergency Medicine | Admitting: Emergency Medicine

## 2014-07-02 ENCOUNTER — Encounter (HOSPITAL_COMMUNITY): Payer: Self-pay | Admitting: General Practice

## 2014-07-02 ENCOUNTER — Emergency Department (INDEPENDENT_AMBULATORY_CARE_PROVIDER_SITE_OTHER): Payer: Managed Care, Other (non HMO)

## 2014-07-02 DIAGNOSIS — J111 Influenza due to unidentified influenza virus with other respiratory manifestations: Secondary | ICD-10-CM | POA: Diagnosis not present

## 2014-07-02 DIAGNOSIS — Z8669 Personal history of other diseases of the nervous system and sense organs: Secondary | ICD-10-CM | POA: Insufficient documentation

## 2014-07-02 DIAGNOSIS — R9431 Abnormal electrocardiogram [ECG] [EKG]: Secondary | ICD-10-CM

## 2014-07-02 DIAGNOSIS — J45909 Unspecified asthma, uncomplicated: Secondary | ICD-10-CM | POA: Insufficient documentation

## 2014-07-02 DIAGNOSIS — Z880 Allergy status to penicillin: Secondary | ICD-10-CM | POA: Insufficient documentation

## 2014-07-02 DIAGNOSIS — Z90710 Acquired absence of both cervix and uterus: Secondary | ICD-10-CM | POA: Insufficient documentation

## 2014-07-02 DIAGNOSIS — Z793 Long term (current) use of hormonal contraceptives: Secondary | ICD-10-CM | POA: Diagnosis not present

## 2014-07-02 DIAGNOSIS — E876 Hypokalemia: Secondary | ICD-10-CM | POA: Insufficient documentation

## 2014-07-02 DIAGNOSIS — R05 Cough: Secondary | ICD-10-CM | POA: Diagnosis present

## 2014-07-02 HISTORY — DX: Unspecified asthma, uncomplicated: J45.909

## 2014-07-02 LAB — CBC WITH DIFFERENTIAL/PLATELET
BASOS ABS: 0 10*3/uL (ref 0.0–0.1)
Basophils Relative: 0 % (ref 0–1)
Eosinophils Absolute: 0 10*3/uL (ref 0.0–0.7)
Eosinophils Relative: 0 % (ref 0–5)
HEMATOCRIT: 37.8 % (ref 36.0–46.0)
Hemoglobin: 12.3 g/dL (ref 12.0–15.0)
LYMPHS PCT: 29 % (ref 12–46)
Lymphs Abs: 1.2 10*3/uL (ref 0.7–4.0)
MCH: 23.2 pg — ABNORMAL LOW (ref 26.0–34.0)
MCHC: 32.5 g/dL (ref 30.0–36.0)
MCV: 71.2 fL — ABNORMAL LOW (ref 78.0–100.0)
MONO ABS: 0.5 10*3/uL (ref 0.1–1.0)
Monocytes Relative: 13 % — ABNORMAL HIGH (ref 3–12)
NEUTROS ABS: 2.4 10*3/uL (ref 1.7–7.7)
Neutrophils Relative %: 58 % (ref 43–77)
PLATELETS: 258 10*3/uL (ref 150–400)
RBC: 5.31 MIL/uL — ABNORMAL HIGH (ref 3.87–5.11)
RDW: 15.6 % — ABNORMAL HIGH (ref 11.5–15.5)
WBC: 4.1 10*3/uL (ref 4.0–10.5)

## 2014-07-02 LAB — COMPREHENSIVE METABOLIC PANEL
ALBUMIN: 3.8 g/dL (ref 3.5–5.2)
ALT: 18 U/L (ref 0–35)
ANION GAP: 12 (ref 5–15)
AST: 26 U/L (ref 0–37)
Alkaline Phosphatase: 43 U/L (ref 39–117)
BUN: 9 mg/dL (ref 6–23)
CALCIUM: 8.5 mg/dL (ref 8.4–10.5)
CO2: 24 mmol/L (ref 19–32)
CREATININE: 0.89 mg/dL (ref 0.50–1.10)
Chloride: 99 mmol/L (ref 96–112)
GFR calc Af Amer: 84 mL/min — ABNORMAL LOW (ref >90)
GFR calc non Af Amer: 72 mL/min — ABNORMAL LOW (ref >90)
Glucose, Bld: 119 mg/dL — ABNORMAL HIGH (ref 70–99)
Potassium: 2.8 mmol/L — ABNORMAL LOW (ref 3.5–5.1)
Sodium: 135 mmol/L (ref 135–145)
TOTAL PROTEIN: 7.2 g/dL (ref 6.0–8.3)
Total Bilirubin: 0.4 mg/dL (ref 0.3–1.2)

## 2014-07-02 LAB — TROPONIN I

## 2014-07-02 LAB — D-DIMER, QUANTITATIVE: D-Dimer, Quant: 0.45 ug/mL-FEU (ref 0.00–0.48)

## 2014-07-02 MED ORDER — PREDNISONE 20 MG PO TABS: 20.0000 mg | ORAL_TABLET | Freq: Two times a day (BID) | ORAL | Status: DC

## 2014-07-02 MED ORDER — IBUPROFEN 800 MG PO TABS
800.0000 mg | ORAL_TABLET | Freq: Once | ORAL | Status: AC
  Administered 2014-07-02: 800 mg via ORAL

## 2014-07-02 MED ORDER — IPRATROPIUM-ALBUTEROL 0.5-2.5 (3) MG/3ML IN SOLN
RESPIRATORY_TRACT | Status: AC
  Filled 2014-07-02: qty 3

## 2014-07-02 MED ORDER — POTASSIUM CHLORIDE CRYS ER 20 MEQ PO TBCR
40.0000 meq | EXTENDED_RELEASE_TABLET | Freq: Once | ORAL | Status: AC
  Administered 2014-07-02: 40 meq via ORAL
  Filled 2014-07-02: qty 2

## 2014-07-02 MED ORDER — SODIUM CHLORIDE 0.9 % IV SOLN
Freq: Once | INTRAVENOUS | Status: AC
  Administered 2014-07-02: 15:00:00 via INTRAVENOUS

## 2014-07-02 MED ORDER — IBUPROFEN 800 MG PO TABS
ORAL_TABLET | ORAL | Status: AC
  Filled 2014-07-02: qty 1

## 2014-07-02 MED ORDER — HYDROCODONE-HOMATROPINE 5-1.5 MG/5ML PO SYRP: 5.0000 mL | ORAL_SOLUTION | Freq: Four times a day (QID) | ORAL | Status: DC | PRN

## 2014-07-02 MED ORDER — ALBUTEROL SULFATE (2.5 MG/3ML) 0.083% IN NEBU
10.0000 mg | INHALATION_SOLUTION | Freq: Once | RESPIRATORY_TRACT | Status: AC
  Administered 2014-07-02: 10 mg via RESPIRATORY_TRACT
  Filled 2014-07-02: qty 12

## 2014-07-02 MED ORDER — IPRATROPIUM-ALBUTEROL 0.5-2.5 (3) MG/3ML IN SOLN
3.0000 mL | Freq: Once | RESPIRATORY_TRACT | Status: AC
  Administered 2014-07-02: 3 mL via RESPIRATORY_TRACT

## 2014-07-02 MED ORDER — ALBUTEROL SULFATE HFA 108 (90 BASE) MCG/ACT IN AERS
1.0000 | INHALATION_SPRAY | Freq: Four times a day (QID) | RESPIRATORY_TRACT | Status: DC | PRN
  Administered 2014-07-02: 1 via RESPIRATORY_TRACT
  Filled 2014-07-02: qty 6.7

## 2014-07-02 NOTE — ED Provider Notes (Signed)
CSN: [id number]     Arrival date & time 07/02/14  1229 History   First MD Initiated Contact with Patient 07/02/14 1313     Chief Complaint  Patient presents with  . Cough   (Consider location/radiation/quality/duration/timing/severity/associated sxs/prior Treatment) HPI  She is a 54 year old woman here for evaluation of cough. Her symptoms started 2 days ago with fevers, body aches, headache, cough. She has developed some throat pain associated with coughing. She also reports chest pain that is worse with coughing. She has had vomiting and diarrhea. Fever at home has been up to 101.7. She reports some chronic sinus issues, but no acute worsening.  She took Tylenol this morning for her fever.  She reports a history of asthma, but has not had an inhaler in several years.  Past Medical History  Diagnosis Date  . Chest pain   . Palpitations   . Insomnia   . Fatigue   . Paresthesias     lower extremities  . H/O: hysterectomy   . Asthma    Past Surgical History  Procedure Laterality Date  . Inguinal hernia repair    . Uterine fibroid surgery    . Abdominal hysterectomy     Family History  Problem Relation Age of Onset  . Hypertension Mother   . Diabetes Mother   . Osteoarthritis Mother   . Stroke Maternal Grandmother    History  Substance Use Topics  . Smoking status: Never Smoker   . Smokeless tobacco: Not on file  . Alcohol Use: Yes     Comment: < 6 drinks weekly   OB History    Gravida Para Term Preterm AB TAB SAB Ectopic Multiple Living   3 3        2      Review of Systems  Constitutional: Positive for fever, activity change and appetite change.  HENT: Positive for sore throat. Negative for congestion, ear pain, rhinorrhea and trouble swallowing.   Respiratory: Positive for cough and wheezing. Negative for shortness of breath.   Cardiovascular: Positive for chest pain.  Gastrointestinal: Positive for vomiting and diarrhea.  Musculoskeletal: Positive for myalgias.   Neurological: Positive for headaches.    Allergies  Influenza virus vacc split pf and Penicillins  Home Medications   Prior to Admission medications   Medication Sig Start Date End Date Taking? Authorizing Provider  acetaminophen (TYLENOL) 325 MG tablet Take 650 mg by mouth every 6 (six) hours as needed for fever.   Yes Historical Provider, MD  estradiol (ESTRACE) 1 MG tablet Take 1 tablet (1 mg total) by mouth daily. 07/06/13  Yes Emily Filbert, MD  ibuprofen (ADVIL,MOTRIN) 800 MG tablet Take 1 tablet (800 mg total) by mouth every 8 (eight) hours as needed. 02/14/13  Yes Lorayne Marek, MD  estrogens conjugated, synthetic B, (ENJUVIA) 1.25 MG tablet Take 1 tablet (1.25 mg total) by mouth daily. 07/05/13   Emily Filbert, MD   BP 165/97 mmHg  Pulse 96  Temp(Src) 98.9 F (37.2 C) (Oral)  Resp 18  SpO2 100% Physical Exam  Constitutional: She is oriented to person, place, and time. She appears well-developed and well-nourished. She appears distressed (appears ill).  HENT:  Head: Normocephalic and atraumatic.  Mouth/Throat: Oropharynx is clear and moist.  Neck: Neck supple.  Cardiovascular: Normal rate, regular rhythm and normal heart sounds.   No murmur heard. Pulmonary/Chest: Effort normal. No respiratory distress. She has wheezes (Scattered expiratory). She has no rales. She exhibits tenderness (Along both sternal borders).  Abdominal: Soft. Bowel sounds are normal. She exhibits no distension. There is tenderness (Mild diffuse). There is no rebound and no guarding.  Lymphadenopathy:    She has no cervical adenopathy.  Neurological: She is alert and oriented to person, place, and time.    ED Course  Procedures (including critical care time) ED ECG REPORT   Date: 07/02/2014  Rate: 100  Rhythm: normal sinus rhythm  QRS Axis: normal  Intervals: normal  ST/T Wave abnormalities: t-wave inversions in inferior leads and V2-V5  Conduction Disutrbances:none  Narrative Interpretation:  NSR with new t-wave inversions  Old EKG Reviewed: changes noted new T-wave inversions  I have personally reviewed the EKG tracing and agree with the computerized printout as noted.  Labs Review Labs Reviewed - No data to display  Imaging Review Dg Chest 2 View  07/02/2014   CLINICAL DATA:  Fever and cough for 3 days  EXAM: CHEST  2 VIEW  COMPARISON:  Jul 21, 2004 chest radiograph ; chest CT Jul 21, 2004  FINDINGS: Lungs are clear. Heart size and pulmonary vascularity are normal. No adenopathy. No bone lesions.  IMPRESSION: No edema or consolidation.   Electronically Signed   By: Lowella Grip III M.D.   On: 07/02/2014 14:26     MDM   1. Influenza   2. T wave inversion in EKG    Ibuprofen 800 mg by mouth given. DuoNeb 0.5-2.5 mg given.  Wheezing is resolved after DuoNeb. She likely has the flu. With associated EKG changes, will transfer to the emergency room for additional evaluation.  Melony Overly, MD 07/02/14 [phone]

## 2014-07-02 NOTE — ED Notes (Signed)
Patient states understanding for medications and follow up care. Demonstration technique was utilized for the inhaler. Spouse was at bedside and very involved with teach back method.

## 2014-07-02 NOTE — Discharge Instructions (Signed)
It is important that he follow up with your primary care doctor regarding her abnormal EKG today. Return to the ER if any worsening of symptoms.  Influenza Influenza ("the flu") is a viral infection of the respiratory tract. It occurs more often in winter months because people spend more time in close contact with one another. Influenza can make you feel very sick. Influenza easily spreads from person to person (contagious). CAUSES  Influenza is caused by a virus that infects the respiratory tract. You can catch the virus by breathing in droplets from an infected person's cough or sneeze. You can also catch the virus by touching something that was recently contaminated with the virus and then touching your mouth, nose, or eyes. RISKS AND COMPLICATIONS You may be at risk for a more severe case of influenza if you smoke cigarettes, have diabetes, have chronic heart disease (such as heart failure) or lung disease (such as asthma), or if you have a weakened immune system. Elderly people and pregnant women are also at risk for more serious infections. The most common problem of influenza is a lung infection (pneumonia). Sometimes, this problem can require emergency medical care and may be life threatening. SIGNS AND SYMPTOMS  Symptoms typically last 4 to 10 days and may include:  Fever.  Chills.  Headache, body aches, and muscle aches.  Sore throat.  Chest discomfort and cough.  Poor appetite.  Weakness or feeling tired.  Dizziness.  Nausea or vomiting. DIAGNOSIS  Diagnosis of influenza is often made based on your history and a physical exam. A nose or throat swab test can be done to confirm the diagnosis. TREATMENT  In mild cases, influenza goes away on its own. Treatment is directed at relieving symptoms. For more severe cases, your health care provider may prescribe antiviral medicines to shorten the sickness. Antibiotic medicines are not effective because the infection is caused by a  virus, not by bacteria. HOME CARE INSTRUCTIONS  Take medicines only as directed by your health care provider.  Use a cool mist humidifier to make breathing easier.  Get plenty of rest until your temperature returns to normal. This usually takes 3 to 4 days.  Drink enough fluid to keep your urine clear or pale yellow.  Cover yourmouth and nosewhen coughing or sneezing,and wash your handswellto prevent thevirusfrom spreading.  Stay homefromwork orschool untilthe fever is gonefor at least 24full day. PREVENTION  An annual influenza vaccination (flu shot) is the best way to avoid getting influenza. An annual flu shot is now routinely recommended for all adults in the [place] IF:  You experiencechest pain, yourcough worsens,or you producemore mucus.  Youhave nausea,vomiting, ordiarrhea.  Your fever returns or gets worse. SEEK IMMEDIATE MEDICAL CARE IF:  You havetrouble breathing, you become short of breath,or your skin ornails becomebluish.  You have severe painor stiffnessin the neck.  You develop a sudden headache, or pain in the face or ear.  You have nausea or vomiting that you cannot control. MAKE SURE YOU:   Understand these instructions.  Will watch your condition.  Will get help right away if you are not doing well or get worse. Document Released: 03/06/2000 Document Revised: 07/24/2013 Document Reviewed: 06/08/2011 [facility] Patient Information 2015 [place], [place]. This information is not intended to replace advice given to you by your health care provider. Make sure you discuss any questions you have with your health care provider.  Hypokalemia Hypokalemia means that the amount of potassium in the blood is lower  than normal.Potassium is a chemical, called an electrolyte, that helps regulate the amount of fluid in the body. It also stimulates muscle contraction and helps nerves function properly.Most of the body's potassium is  inside of cells, and only a very small amount is in the blood. Because the amount in the blood is so small, minor changes can be life-threatening. CAUSES  Antibiotics.  Diarrhea or vomiting.  Using laxatives too much, which can cause diarrhea.  Chronic kidney disease.  Water pills (diuretics).  Eating disorders (bulimia).  Low magnesium level.  Sweating a lot. SIGNS AND SYMPTOMS  Weakness.  Constipation.  Fatigue.  Muscle cramps.  Mental confusion.  Skipped heartbeats or irregular heartbeat (palpitations).  Tingling or numbness. DIAGNOSIS  Your health care provider can diagnose hypokalemia with blood tests. In addition to checking your potassium level, your health care provider may also check other lab tests. TREATMENT Hypokalemia can be treated with potassium supplements taken by mouth or adjustments in your current medicines. If your potassium level is very low, you may need to get potassium through a vein (IV) and be monitored in the hospital. A diet high in potassium is also helpful. Foods high in potassium are:  Nuts, such as peanuts and pistachios.  Seeds, such as sunflower seeds and pumpkin seeds.  Peas, lentils, and lima beans.  Whole grain and bran cereals and breads.  Fresh fruit and vegetables, such as apricots, avocado, bananas, cantaloupe, kiwi, oranges, tomatoes, asparagus, and potatoes.  Orange and tomato juices.  Red meats.  Fruit yogurt. HOME CARE INSTRUCTIONS  Take all medicines as prescribed by your health care provider.  Maintain a healthy diet by including nutritious food, such as fruits, vegetables, nuts, whole grains, and lean meats.  If you are taking a laxative, be sure to follow the directions on the label. SEEK MEDICAL CARE IF:  Your weakness gets worse.  You feel your heart pounding or racing.  You are vomiting or having diarrhea.  You are diabetic and having trouble keeping your blood glucose in the normal range. SEEK  IMMEDIATE MEDICAL CARE IF:  You have chest pain, shortness of breath, or dizziness.  You are vomiting or having diarrhea for more than 2 days.  You faint. MAKE SURE YOU:   Understand these instructions.  Will watch your condition.  Will get help right away if you are not doing well or get worse. Document Released: 03/09/2005 Document Revised: 12/28/2012 Document Reviewed: 09/09/2012 [facility] Patient Information 2015 [place], [place]. This information is not intended to replace advice given to you by your health care provider. Make sure you discuss any questions you have with your health care provider.  Chest Wall Pain Chest wall pain is pain in or around the bones and muscles of your chest. It may take up to 6 weeks to get better. It may take longer if you must stay physically active in your work and activities.  CAUSES  Chest wall pain may happen on its own. However, it may be caused by:  A viral illness like the flu.  Injury.  Coughing.  Exercise.  Arthritis.  Fibromyalgia.  Shingles. HOME CARE INSTRUCTIONS   Avoid overtiring physical activity. Try not to strain or perform activities that cause pain. This includes any activities using your chest or your abdominal and side muscles, especially if heavy weights are used.  Put ice on the sore area.  Put ice in a plastic bag.  Place a towel between your skin and the bag.  Leave the ice  on for 15-20 minutes per hour while awake for the first 2 days.  Only take over-the-counter or prescription medicines for pain, discomfort, or fever as directed by your caregiver. SEEK IMMEDIATE MEDICAL CARE IF:   Your pain increases, or you are very uncomfortable.  You have a fever.  Your chest pain becomes worse.  You have new, unexplained symptoms.  You have nausea or vomiting.  You feel sweaty or lightheaded.  You have a cough with phlegm (sputum), or you cough up blood. MAKE SURE YOU:   Understand these  instructions.  Will watch your condition.  Will get help right away if you are not doing well or get worse. Document Released: 03/09/2005 Document Revised: 06/01/2011 Document Reviewed: 11/03/2010 [facility] Patient Information 2015 [place], [place]. This information is not intended to replace advice given to you by your health care provider. Make sure you discuss any questions you have with your health care provider.

## 2014-07-02 NOTE — ED Notes (Signed)
Pt transferred from urgent care to ED. Pt complaining of fever, chills, sore throat, cough, headache, N/V and loose stools for the past three days. Pt was given a breathing treatment and 800 mg of  ibruprofen. Pt was sent to ED for a T wave inversion on EKG. V/S 130/90, HR 112, SPO2 98 on RA.

## 2014-07-02 NOTE — ED Provider Notes (Signed)
  Physical Exam  BP 133/78 mmHg  Pulse 101  Temp(Src) 98.6 F (37 C) (Oral)  Resp 18  SpO2 97%  Physical Exam  Constitutional: She is oriented to person, place, and time. She appears well-developed and well-nourished. No distress.  HENT:  Head: Normocephalic and atraumatic.  Mouth/Throat: Oropharynx is clear and moist. No oropharyngeal exudate.  Eyes: Right eye exhibits no discharge. Left eye exhibits no discharge. No scleral icterus.  Neck: Normal range of motion.  Cardiovascular: Normal rate, regular rhythm and normal heart sounds.   No murmur heard. Pulmonary/Chest: Effort normal and breath sounds normal. No respiratory distress.  Abdominal: Soft. There is no tenderness.  Musculoskeletal: Normal range of motion. She exhibits no edema or tenderness.  Neurological: She is alert and oriented to person, place, and time. No cranial nerve deficit. Coordination normal.  Skin: Skin is warm and dry. No rash noted. She is not diaphoretic.  Psychiatric: She has a normal mood and affect.    ED Course  Procedures  MDM Patient signed out to me by Alyse Low, PA-C with plan to follow-up on delta troponin and discharge home if second troponin negative on therapy with prednisone and albuterol inhaler for symptomatic therapy.  Delta troponin negative. On reassessment patient is well-appearing, nontoxic tachycardic, nontachypneic, non-hypoxic, and in no acute distress. Lung exam is clear. Patient given albuterol inhaler, and discharged to follow-up with her primary care physician. Patient educated on her hypokalemia. It is strongly encouraged patient follow up with her primary care doctor within the next week regarding the EKG changes found today as well as her current symptoms to ensure resolution. Discussed return precautions with patient, and patient verbalizes understanding and agreement of this plan. I encouraged patient to call or return to the ER with any worsening of symptoms or should she  have any questions or concerns.  BP 133/78 mmHg  Pulse 101  Temp(Src) 98.6 F (37 C) (Oral)  Resp 18  SpO2 97%  Signed,  Dahlia Bailiff, PA-C 11:41 PM       Dahlia Bailiff, PA-C 07/02/14 [id number]  Daleen Bo, MD 07/03/14 [phone]

## 2014-07-02 NOTE — ED Provider Notes (Signed)
CSN: [id number]     Arrival date & time 07/02/14  1527 History   None    No chief complaint on file.    (Consider location/radiation/quality/duration/timing/severity/associated sxs/prior Treatment) Patient is a 54 y.o. female presenting with cough. The history is provided by the patient. No language interpreter was used.  Cough Cough characteristics:  Non-productive Severity:  Moderate Onset quality:  Gradual Duration:  3 days Timing:  Constant Progression:  Worsening Chronicity:  New Smoker: no   Context: upper respiratory infection   Relieved by:  Nothing Worsened by:  Nothing tried Ineffective treatments:  None tried Associated symptoms: chest pain, chills, myalgias, shortness of breath and wheezing     Past Medical History  Diagnosis Date  . Chest pain   . Palpitations   . Insomnia   . Fatigue   . Paresthesias     lower extremities  . H/O: hysterectomy   . Asthma    Past Surgical History  Procedure Laterality Date  . Inguinal hernia repair    . Uterine fibroid surgery    . Abdominal hysterectomy     Family History  Problem Relation Age of Onset  . Hypertension Mother   . Diabetes Mother   . Osteoarthritis Mother   . Stroke Maternal Grandmother    History  Substance Use Topics  . Smoking status: Never Smoker   . Smokeless tobacco: Not on file  . Alcohol Use: Yes     Comment: < 6 drinks weekly   OB History    Gravida Para Term Preterm AB TAB SAB Ectopic Multiple Living   3 3        2      Review of Systems  Constitutional: Positive for chills.  Respiratory: Positive for cough, shortness of breath and wheezing.   Cardiovascular: Positive for chest pain.  Musculoskeletal: Positive for myalgias.  All other systems reviewed and are negative.     Allergies  Influenza virus vacc split pf and Penicillins  Home Medications   Prior to Admission medications   Medication Sig Start Date End Date Taking? Authorizing Provider  acetaminophen (TYLENOL)  325 MG tablet Take 650 mg by mouth every 6 (six) hours as needed for fever.    Historical Provider, MD  estradiol (ESTRACE) 1 MG tablet Take 1 tablet (1 mg total) by mouth daily. 07/06/13   Emily Filbert, MD  estrogens conjugated, synthetic B, (ENJUVIA) 1.25 MG tablet Take 1 tablet (1.25 mg total) by mouth daily. 07/05/13   Emily Filbert, MD  ibuprofen (ADVIL,MOTRIN) 800 MG tablet Take 1 tablet (800 mg total) by mouth every 8 (eight) hours as needed. 02/14/13   Lorayne Marek, MD   There were no vitals taken for this visit. Physical Exam  Constitutional: She is oriented to person, place, and time. She appears well-developed and well-nourished.  HENT:  Head: Normocephalic.  Right Ear: External ear normal.  Left Ear: External ear normal.  Nose: Nose normal.  Mouth/Throat: Oropharynx is clear and moist.  Eyes: Conjunctivae and EOM are normal. Pupils are equal, round, and reactive to light.  Neck: Normal range of motion.  Cardiovascular: Normal rate and normal heart sounds.   Pulmonary/Chest: Effort normal.  Abdominal: Soft. She exhibits no distension.  Musculoskeletal: Normal range of motion.  Neurological: She is alert and oriented to person, place, and time.  Psychiatric: She has a normal mood and affect.  Nursing note and vitals reviewed.   ED Course  Procedures (including critical care time) Labs Review Labs Reviewed -  No data to display  Imaging Review Dg Chest 2 View  07/02/2014   CLINICAL DATA:  Fever and cough for 3 days  EXAM: CHEST  2 VIEW  COMPARISON:  Jul 21, 2004 chest radiograph ; chest CT Jul 21, 2004  FINDINGS: Lungs are clear. Heart size and pulmonary vascularity are normal. No adenopathy. No bone lesions.  IMPRESSION: No edema or consolidation.   Electronically Signed   By: Lowella Grip III M.D.   On: 07/02/2014 14:26     EKG Interpretation   Date/Time:  Monday July 02 2014 15:38:58 EDT Ventricular Rate:  109 PR Interval:  178 QRS Duration: 82 QT Interval:   331 QTC Calculation: 446 R Axis:   85 Text Interpretation:  Sinus tachycardia Right atrial enlargement  Borderline T abnormalities, anterior leads Confirmed by Hazle Coca [phone])  on 07/02/2014 4:01:42 PM     Results for orders placed or performed during the hospital encounter of 07/02/14  Troponin I  Result Value Ref Range   Troponin I <0.03 <0.031 ng/mL  CBC with Differential/Platelet  Result Value Ref Range   WBC 4.1 4.0 - 10.5 K/uL   RBC 5.31 (H) 3.87 - 5.11 MIL/uL   Hemoglobin 12.3 12.0 - 15.0 g/dL   HCT 37.8 36.0 - 46.0 %   MCV 71.2 (L) 78.0 - 100.0 fL   MCH 23.2 (L) 26.0 - 34.0 pg   MCHC 32.5 30.0 - 36.0 g/dL   RDW 15.6 (H) 11.5 - 15.5 %   Platelets 258 150 - 400 K/uL   Neutrophils Relative % 58 43 - 77 %   Neutro Abs 2.4 1.7 - 7.7 K/uL   Lymphocytes Relative 29 12 - 46 %   Lymphs Abs 1.2 0.7 - 4.0 K/uL   Monocytes Relative 13 (H) 3 - 12 %   Monocytes Absolute 0.5 0.1 - 1.0 K/uL   Eosinophils Relative 0 0 - 5 %   Eosinophils Absolute 0.0 0.0 - 0.7 K/uL   Basophils Relative 0 0 - 1 %   Basophils Absolute 0.0 0.0 - 0.1 K/uL  Comprehensive metabolic panel  Result Value Ref Range   Sodium 135 135 - 145 mmol/L   Potassium 2.8 (L) 3.5 - 5.1 mmol/L   Chloride 99 96 - 112 mmol/L   CO2 24 19 - 32 mmol/L   Glucose, Bld 119 (H) 70 - 99 mg/dL   BUN 9 6 - 23 mg/dL   Creatinine, Ser 0.89 0.50 - 1.10 mg/dL   Calcium 8.5 8.4 - 10.5 mg/dL   Total Protein 7.2 6.0 - 8.3 g/dL   Albumin 3.8 3.5 - 5.2 g/dL   AST 26 0 - 37 U/L   ALT 18 0 - 35 U/L   Alkaline Phosphatase 43 39 - 117 U/L   Total Bilirubin 0.4 0.3 - 1.2 mg/dL   GFR calc non Af Amer 72 (L) >90 mL/min   GFR calc Af Amer 84 (L) >90 mL/min   Anion gap 12 5 - 15  D-dimer, quantitative  Result Value Ref Range   D-Dimer, Quant 0.45 0.00 - 0.48 ug/mL-FEU   Dg Chest 2 View  07/02/2014   CLINICAL DATA:  Fever and cough for 3 days  EXAM: CHEST  2 VIEW  COMPARISON:  Jul 21, 2004 chest radiograph ; chest CT Jul 21, 2004   FINDINGS: Lungs are clear. Heart size and pulmonary vascularity are normal. No adenopathy. No bone lesions.  IMPRESSION: No edema or consolidation.   Electronically Signed   By:  Lowella Grip III M.D.   On: 07/02/2014 14:26    MDM    Final diagnoses:  None    Pt's care turned over to Livingston Wheeler, PA-C 07/02/14 [place], MD 07/03/14 0700

## 2014-07-02 NOTE — ED Notes (Addendum)
3rd day of myalgias, fever--up to 101.7--chills, productive cough, headache  and sore throat  Also 6 loose stools the past 2 days

## 2014-08-10 ENCOUNTER — Ambulatory Visit: Payer: Self-pay | Admitting: Internal Medicine

## 2014-09-14 ENCOUNTER — Ambulatory Visit: Payer: Self-pay | Admitting: Internal Medicine

## 2014-10-05 ENCOUNTER — Encounter: Payer: Self-pay | Admitting: Internal Medicine

## 2014-10-05 ENCOUNTER — Ambulatory Visit (INDEPENDENT_AMBULATORY_CARE_PROVIDER_SITE_OTHER): Payer: Managed Care, Other (non HMO) | Admitting: Internal Medicine

## 2014-10-05 VITALS — BP 120/82 | HR 75 | Temp 98.0°F | Resp 18 | Ht 67.0 in | Wt 175.4 lb

## 2014-10-05 DIAGNOSIS — J45909 Unspecified asthma, uncomplicated: Secondary | ICD-10-CM | POA: Insufficient documentation

## 2014-10-05 DIAGNOSIS — R5382 Chronic fatigue, unspecified: Secondary | ICD-10-CM

## 2014-10-05 DIAGNOSIS — M25561 Pain in right knee: Secondary | ICD-10-CM

## 2014-10-05 DIAGNOSIS — M25562 Pain in left knee: Secondary | ICD-10-CM

## 2014-10-05 DIAGNOSIS — N951 Menopausal and female climacteric states: Secondary | ICD-10-CM

## 2014-10-05 DIAGNOSIS — R5383 Other fatigue: Secondary | ICD-10-CM | POA: Insufficient documentation

## 2014-10-05 DIAGNOSIS — Z8541 Personal history of malignant neoplasm of cervix uteri: Secondary | ICD-10-CM | POA: Diagnosis not present

## 2014-10-05 MED ORDER — IBUPROFEN 800 MG PO TABS: 800.0000 mg | ORAL_TABLET | Freq: Three times a day (TID) | ORAL | Status: DC | PRN

## 2014-10-05 NOTE — Progress Notes (Signed)
Patient ID: Karen Molina, female   DOB: 1961-02-06, 54 y.o.   MRN: [id number]    Location:    [facility]   Place of Service:   OFFICE   Chief Complaint  Patient presents with  . Establish Care    HTN    HPI:  54 yo female seen today as a new pt. She would like to discuss hot flashes despite taking HRT. Last pap smear 11/2010. She was without medical insurance and has been using the [facility] for care. She has a hx TAH/BSO. She has vaginal dryness  She is c/a asthma sx's. She was tx for influenza in Apr 2016 and wheezing noted on exam. She states she "weaned" herself off her HFA several yrs ago. She reports occasional chest tightness with SOB at rest x 1 yr. No CP. No DOE. Her CXR from 4/16 ED visit revealed no acute changes  She does not walk due to knee pain and swelling. She rec'd knee injections in the past which helped. She also has left lower back pain that worsens with walking and improves with lying down and sitting. 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 Chest pain   . Palpitations   . Insomnia   . Fatigue   . Paresthesias     lower extremities  . H/O: hysterectomy   . Asthma     Past Surgical History  Procedure Laterality Date  . Inguinal hernia repair    . Uterine fibroid surgery    . Abdominal hysterectomy    . Cervical ablation      Patient Care Team: Lorayne Marek, MD as PCP - General (Internal Medicine)  History   Social History  . Marital Status: Divorced    Spouse Name: N/A  . Number of Children: N/A  . Years of Education: N/A   Occupational History  . Not on file.   Social History Main Topics  . Smoking status: Former Smoker -- 0.50 packs/day    Types: Cigarettes  . Smokeless tobacco: Never Used  . Alcohol Use: 2.4 oz/week    4 Cans of beer per week     Comment: < 6 drinks weekly  . Drug Use: No  . Sexual Activity: Not on file   Other Topics Concern  . Not on  file   Social History Narrative   Diet: Regular        Do you drink/ eat things with caffeine? No      Marital status:    Single                           What year were you married ?      Do you live in a house, apartment,assistred living, condo, trailer, etc.)? House      Is it one or more stories? 2      How many persons live in your home ? 2      Do you have any pets in your home ?(please list) No      Current or past profession: Quality Auditor       Do you exercise? Yes                             Type & how often: Walk      Do you have a living will?  No      Do you have a DNR form?  No                     If not, do you want to discuss one? Yes      Do you have signed POA?HPOA forms?  No               If so, please bring to your        appointment           reports that she has quit smoking. Her smoking use included Cigarettes. She smoked 0.50 packs per day. She has never used smokeless tobacco. She reports that she drinks about 2.4 oz of alcohol per week. She reports that she does not use illicit drugs.  Family History  Problem Relation Age of Onset  . Hypertension Mother   . Diabetes Mother   . Osteoarthritis Mother   . Stroke Maternal Grandmother   . Diabetes Brother   . Hypertension Brother   . HIV/AIDS Brother   . Drug abuse Brother   . Lupus Sister   . Breast cancer Daughter   . Arthritis Sister    Family Status  Relation Status Death Age  . Mother Alive   . Maternal Grandmother Deceased   . Father Alive   . Brother Alive   . Brother Deceased   . Brother Alive   . Brother Alive   . Sister Alive   . Daughter Alive   . Son Alive      There is no immunization history on file for this patient.  Allergies  Allergen Reactions  . Influenza Virus Vacc Split Pf Other (See Comments)    "deadly sick "  . Penicillins Hives, Itching and Swelling    Medications: Patient's Medications  New Prescriptions   No medications on file  Previous  Medications   ACETAMINOPHEN (TYLENOL) 325 MG TABLET    Take 650 mg by mouth every 6 (six) hours as needed for fever.   ESTRADIOL (ESTRACE) 1 MG TABLET    Take 1 tablet (1 mg total) by mouth daily.   ESTROGENS CONJUGATED, SYNTHETIC B, (ENJUVIA) 1.25 MG TABLET    Take 1 tablet (1.25 mg total) by mouth daily.   HYDROCODONE-HOMATROPINE (HYCODAN) 5-1.5 MG/5ML SYRUP    Take 5 mLs by mouth every 6 (six) hours as needed for cough.   IBUPROFEN (ADVIL,MOTRIN) 800 MG TABLET    Take 1 tablet (800 mg total) by mouth every 8 (eight) hours as needed.   PREDNISONE (DELTASONE) 20 MG TABLET    Take 1 tablet (20 mg total) by mouth 2 (two) times daily with a meal.  Modified Medications   No medications on file  Discontinued Medications   No medications on file    Review of Systems  Constitutional: Positive for diaphoresis and fatigue. Negative for fever, chills, activity change and appetite change.  HENT: Positive for dental problem, hearing loss and sinus pressure. Negative for ear pain and sore throat.   Eyes: Negative for visual disturbance.  Respiratory: Positive for cough and wheezing. Negative for chest tightness and shortness of breath.   Cardiovascular: Negative for chest pain, palpitations and leg swelling.  Gastrointestinal: Negative for nausea, vomiting, abdominal pain, diarrhea, constipation and blood in stool.  Endocrine:       Hot flashes  Genitourinary: Positive for frequency. Negative for dysuria.  Musculoskeletal: Positive for back pain, joint swelling and arthralgias.  Allergic/Immunologic: Positive for environmental  allergies.  Neurological: Negative for dizziness, tremors, numbness and headaches.  Hematological: Positive for adenopathy.  Psychiatric/Behavioral: Positive for sleep disturbance, dysphoric mood and agitation. The patient is not nervous/anxious.     Filed Vitals:   10/05/14 1323  BP: 120/82  Pulse: 75  Temp: 98 F (36.7 C)  TempSrc: Oral  Resp: 18  Height: $Remove'5\' 7"'fJncpdc$   (1.702 m)  Weight: 175 lb 6.4 oz (79.561 kg)  SpO2: 95%   Body mass index is 27.47 kg/(m^2).  Physical Exam  Constitutional: She is oriented to person, place, and time. She appears well-developed and well-nourished. No distress.  Flat affect in NAD  HENT:  Mouth/Throat: Oropharynx is clear and moist. No oropharyngeal exudate.  Eyes: Pupils are equal, round, and reactive to light. No scleral icterus.  Neck: Neck supple. Carotid bruit is not present. No tracheal deviation present. No thyromegaly present.  Cardiovascular: Normal rate, regular rhythm, normal heart sounds and intact distal pulses.  Exam reveals no gallop and no friction rub.   No murmur heard. No LE edema b/l. no calf TTP.   Pulmonary/Chest: Effort normal and breath sounds normal. No stridor. No respiratory distress. She has no wheezes. She has no rales.  Abdominal: Soft. Bowel sounds are normal. She exhibits no distension (epigastric and suprapubic) and no mass. There is no hepatomegaly. There is tenderness. There is no rebound and no guarding.  Musculoskeletal: She exhibits edema and tenderness.       Back:  Short left leg; right pelvic outflare; right SI joint restriction; sacral torsion; right knee swelling with reduced ROM and crepitus on ROM; neg drawer test on right; neg SLR; no increased lordosis  Lymphadenopathy:    She has no cervical adenopathy.  Neurological: She is alert and oriented to person, place, and time.  Skin: Skin is warm and dry. No rash noted.  Psychiatric: Her behavior is normal. Thought content normal. Her affect is blunt.     Labs reviewed: No visits with results within 3 Month(s) from this visit. Latest known visit with results is:  Admission on 07/02/2014, Discharged on 07/02/2014  Component Date Value Ref Range Status  . Troponin I 07/02/2014 <0.03  <0.031 ng/mL Final   Comment:        NO INDICATION OF MYOCARDIAL INJURY.   . WBC 07/02/2014 4.1  4.0 - 10.5 K/uL Final  . RBC 07/02/2014  5.31* 3.87 - 5.11 MIL/uL Final  . Hemoglobin 07/02/2014 12.3  12.0 - 15.0 g/dL Final  . HCT 07/02/2014 37.8  36.0 - 46.0 % Final  . MCV 07/02/2014 71.2* 78.0 - 100.0 fL Final  . MCH 07/02/2014 23.2* 26.0 - 34.0 pg Final  . MCHC 07/02/2014 32.5  30.0 - 36.0 g/dL Final  . RDW 07/02/2014 15.6* 11.5 - 15.5 % Final  . Platelets 07/02/2014 258  150 - 400 K/uL Final  . Neutrophils Relative % 07/02/2014 58  43 - 77 % Final  . Neutro Abs 07/02/2014 2.4  1.7 - 7.7 K/uL Final  . Lymphocytes Relative 07/02/2014 29  12 - 46 % Final  . Lymphs Abs 07/02/2014 1.2  0.7 - 4.0 K/uL Final  . Monocytes Relative 07/02/2014 13* 3 - 12 % Final  . Monocytes Absolute 07/02/2014 0.5  0.1 - 1.0 K/uL Final  . Eosinophils Relative 07/02/2014 0  0 - 5 % Final  . Eosinophils Absolute 07/02/2014 0.0  0.0 - 0.7 K/uL Final  . Basophils Relative 07/02/2014 0  0 - 1 % Final  . Basophils Absolute 07/02/2014  0.0  0.0 - 0.1 K/uL Final  . Sodium 07/02/2014 135  135 - 145 mmol/L Final  . Potassium 07/02/2014 2.8* 3.5 - 5.1 mmol/L Final  . Chloride 07/02/2014 99  96 - 112 mmol/L Final  . CO2 07/02/2014 24  19 - 32 mmol/L Final  . Glucose, Bld 07/02/2014 119* 70 - 99 mg/dL Final  . BUN 07/02/2014 9  6 - 23 mg/dL Final  . Creatinine, Ser 07/02/2014 0.89  0.50 - 1.10 mg/dL Final  . Calcium 07/02/2014 8.5  8.4 - 10.5 mg/dL Final  . Total Protein 07/02/2014 7.2  6.0 - 8.3 g/dL Final  . Albumin 07/02/2014 3.8  3.5 - 5.2 g/dL Final  . AST 07/02/2014 26  0 - 37 U/L Final  . ALT 07/02/2014 18  0 - 35 U/L Final  . Alkaline Phosphatase 07/02/2014 43  39 - 117 U/L Final  . Total Bilirubin 07/02/2014 0.4  0.3 - 1.2 mg/dL Final  . GFR calc non Af Amer 07/02/2014 72* >90 mL/min Final  . GFR calc Af Amer 07/02/2014 84* >90 mL/min Final   Comment: (NOTE) The eGFR has been calculated using the CKD EPI equation. This calculation has not been validated in all clinical situations. eGFR's persistently <90 mL/min signify possible Chronic  Kidney Disease.   . Anion gap 07/02/2014 12  5 - 15 Final  . D-Dimer, Quant 07/02/2014 0.45  0.00 - 0.48 ug/mL-FEU Final   Comment:        AT THE INHOUSE ESTABLISHED CUTOFF VALUE OF 0.48 ug/mL FEU, THIS ASSAY HAS BEEN DOCUMENTED IN THE LITERATURE TO HAVE A SENSITIVITY AND NEGATIVE PREDICTIVE VALUE OF AT LEAST 98 TO 99%.  THE TEST RESULT SHOULD BE CORRELATED WITH AN ASSESSMENT OF THE CLINICAL PROBABILITY OF DVT / VTE.   [name] Troponin I 07/02/2014 <0.03  <0.031 ng/mL Final   Comment:        NO INDICATION OF MYOCARDIAL INJURY.     No results found.   Assessment/Plan   ICD-9-CM ICD-10-CM   1. Bilateral knee pain  R>L 719.46 M25.561 DG Knee Complete 4 Views Right    M25.562 ibuprofen (ADVIL,MOTRIN) 800 MG tablet     DISCONTINUED: ibuprofen (ADVIL,MOTRIN) 800 MG tablet  2. Extrinsic asthma, unspecified asthma severity, uncomplicated - severity TBD 493.00 J45.909 Pulmonary function test  3. Chronic fatigue - etiology unknown 780.79 R53.82 CMP     CBC with Differential     Vitamin D, 25-hydroxy     TSH     Urinalysis with Reflex Microscopic  4. Hx of cervical cancer s/p ablation V10.41 Z85.41   5. Hot flashes, menopausal 627.2 N95.1     --if labs ok, will consider changing HRT to vaginal cream  --Follow up in 1 month for CPE  [facility] S. Perlie Gold  [facility] [address] [place], [place] [address] [phone] Cell (Monday-Friday 8 AM - 5 PM) [phone] After 5 PM and follow prompts

## 2014-10-05 NOTE — Patient Instructions (Signed)
Follow up in 1 month for CPE  Will call with results.   Will call with imaging appt

## 2014-10-06 LAB — COMPREHENSIVE METABOLIC PANEL
A/G RATIO: 1.6 (ref 1.1–2.5)
ALT: 17 IU/L (ref 0–32)
AST: 17 IU/L (ref 0–40)
Albumin: 4.3 g/dL (ref 3.5–5.5)
Alkaline Phosphatase: 44 IU/L (ref 39–117)
BUN/Creatinine Ratio: 21 (ref 9–23)
BUN: 15 mg/dL (ref 6–24)
Bilirubin Total: 0.3 mg/dL (ref 0.0–1.2)
CALCIUM: 9.5 mg/dL (ref 8.7–10.2)
CO2: 22 mmol/L (ref 18–29)
Chloride: 101 mmol/L (ref 97–108)
Creatinine, Ser: 0.7 mg/dL (ref 0.57–1.00)
GFR calc Af Amer: 114 mL/min/{1.73_m2} (ref >59)
GFR, EST NON AFRICAN AMERICAN: 99 mL/min/{1.73_m2} (ref >59)
GLUCOSE: 88 mg/dL (ref 65–99)
Globulin, Total: 2.7 g/dL (ref 1.5–4.5)
POTASSIUM: 4.1 mmol/L (ref 3.5–5.2)
Sodium: 140 mmol/L (ref 134–144)
TOTAL PROTEIN: 7 g/dL (ref 6.0–8.5)

## 2014-10-06 LAB — CBC WITH DIFFERENTIAL/PLATELET
Basophils Absolute: 0 10*3/uL (ref 0.0–0.2)
Basos: 0 %
EOS (ABSOLUTE): 0.1 10*3/uL (ref 0.0–0.4)
Eos: 2 %
HEMATOCRIT: 36.6 % (ref 34.0–46.6)
Hemoglobin: 11.2 g/dL (ref 11.1–15.9)
Immature Grans (Abs): 0 10*3/uL (ref 0.0–0.1)
Immature Granulocytes: 0 %
LYMPHS: 38 %
Lymphocytes Absolute: 2.4 10*3/uL (ref 0.7–3.1)
MCH: 22.5 pg — ABNORMAL LOW (ref 26.6–33.0)
MCHC: 30.6 g/dL — ABNORMAL LOW (ref 31.5–35.7)
MCV: 74 fL — ABNORMAL LOW (ref 79–97)
Monocytes Absolute: 0.4 10*3/uL (ref 0.1–0.9)
Monocytes: 7 %
NEUTROS ABS: 3.3 10*3/uL (ref 1.4–7.0)
NEUTROS PCT: 53 %
Platelets: 312 10*3/uL (ref 150–379)
RBC: 4.97 x10E6/uL (ref 3.77–5.28)
RDW: 17.2 % — AB (ref 12.3–15.4)
WBC: 6.2 10*3/uL (ref 3.4–10.8)

## 2014-10-06 LAB — URINALYSIS, ROUTINE W REFLEX MICROSCOPIC
Bilirubin, UA: NEGATIVE
Glucose, UA: NEGATIVE
Leukocytes, UA: NEGATIVE
Nitrite, UA: NEGATIVE
PROTEIN UA: NEGATIVE
RBC, UA: NEGATIVE
Specific Gravity, UA: 1.027 (ref 1.005–1.030)
Urobilinogen, Ur: 0.2 mg/dL (ref 0.2–1.0)
pH, UA: 5.5 (ref 5.0–7.5)

## 2014-10-06 LAB — VITAMIN D 25 HYDROXY (VIT D DEFICIENCY, FRACTURES): VIT D 25 HYDROXY: 8.1 ng/mL — AB (ref 30.0–100.0)

## 2014-10-06 LAB — TSH: TSH: 0.651 u[IU]/mL (ref 0.450–4.500)

## 2014-10-08 ENCOUNTER — Other Ambulatory Visit: Payer: Self-pay

## 2014-10-08 MED ORDER — PAROXETINE HCL 10 MG PO TABS: 10.0000 mg | ORAL_TABLET | Freq: Every day | ORAL | Status: DC

## 2014-10-08 MED ORDER — ESTRADIOL 1 MG PO TABS: 1.0000 mg | ORAL_TABLET | Freq: Every day | ORAL | Status: DC

## 2014-10-09 ENCOUNTER — Other Ambulatory Visit: Payer: Self-pay

## 2014-10-09 LAB — IRON AND TIBC
Iron Saturation: 21 % (ref 15–55)
Iron: 60 ug/dL (ref 27–159)
TIBC: 280 ug/dL (ref 250–450)
UIBC: 220 ug/dL (ref 131–425)

## 2014-10-09 LAB — SPECIMEN STATUS REPORT

## 2014-10-09 LAB — FERRITIN: FERRITIN: 219 ng/mL — AB (ref 15–150)

## 2014-10-10 ENCOUNTER — Other Ambulatory Visit: Payer: Self-pay

## 2014-10-10 MED ORDER — FENOPROFEN CALCIUM 600 MG PO TABS: 600.0000 mg | ORAL_TABLET | Freq: Every day | ORAL | Status: DC

## 2014-10-11 ENCOUNTER — Telehealth: Payer: Self-pay

## 2014-10-11 ENCOUNTER — Ambulatory Visit
Admission: RE | Admit: 2014-10-11 | Discharge: 2014-10-11 | Disposition: A | Discharge status: Home / Self Care | Payer: Managed Care, Other (non HMO) | Source: Ambulatory Visit | Attending: Internal Medicine | Admitting: Internal Medicine

## 2014-10-11 ENCOUNTER — Encounter (INDEPENDENT_AMBULATORY_CARE_PROVIDER_SITE_OTHER): Payer: Self-pay

## 2014-10-11 DIAGNOSIS — M25561 Pain in right knee: Secondary | ICD-10-CM

## 2014-10-11 DIAGNOSIS — M25562 Pain in left knee: Principal | ICD-10-CM

## 2014-10-11 NOTE — Telephone Encounter (Signed)
Spoke with patient told her what her lab results were, she wanted to know if she could have me get her some vitamin d I  told her it was not on her medication list but she could talk to the doctor to see if she needed it. But she could get over the counter if she wanted.

## 2014-10-26 ENCOUNTER — Ambulatory Visit: Payer: Self-pay | Admitting: Internal Medicine

## 2014-11-09 ENCOUNTER — Other Ambulatory Visit: Payer: Self-pay

## 2014-11-09 ENCOUNTER — Ambulatory Visit (INDEPENDENT_AMBULATORY_CARE_PROVIDER_SITE_OTHER): Payer: Managed Care, Other (non HMO) | Admitting: Internal Medicine

## 2014-11-09 ENCOUNTER — Ambulatory Visit
Admission: RE | Admit: 2014-11-09 | Discharge: 2014-11-09 | Disposition: A | Discharge status: Home / Self Care | Payer: Managed Care, Other (non HMO) | Source: Ambulatory Visit

## 2014-11-09 DIAGNOSIS — J45909 Unspecified asthma, uncomplicated: Secondary | ICD-10-CM

## 2014-11-09 DIAGNOSIS — Z1231 Encounter for screening mammogram for malignant neoplasm of breast: Secondary | ICD-10-CM

## 2014-11-09 LAB — PULMONARY FUNCTION TEST
DL/VA % pred: 79 %
DL/VA: 3.99 ml/min/mmHg/L
DLCO unc % pred: 65 %
DLCO unc: 17.75 ml/min/mmHg
FEF 25-75 POST: 1.92 L/s
FEF 25-75 Pre: 1.38 L/sec
FEF2575-%Change-Post: 39 %
FEF2575-%PRED-POST: 79 %
FEF2575-%Pred-Pre: 57 %
FEV1-%CHANGE-POST: 11 %
FEV1-%PRED-PRE: 81 %
FEV1-%Pred-Post: 90 %
FEV1-POST: 2.18 L
FEV1-Pre: 1.95 L
FEV1FVC-%CHANGE-POST: 6 %
FEV1FVC-%Pred-Pre: 86 %
FEV6-%Change-Post: 4 %
FEV6-%Pred-Post: 101 %
FEV6-%Pred-Pre: 96 %
FEV6-Post: 2.97 L
FEV6-Pre: 2.83 L
FEV6FVC-%Pred-Post: 103 %
FEV6FVC-%Pred-Pre: 103 %
FVC-%Change-Post: 4 %
FVC-%Pred-Post: 98 %
FVC-%Pred-Pre: 93 %
FVC-POST: 2.97 L
FVC-Pre: 2.83 L
PRE FEV1/FVC RATIO: 69 %
PRE FEV6/FVC RATIO: 100 %
Post FEV1/FVC ratio: 73 %
Post FEV6/FVC ratio: 100 %

## 2014-11-09 NOTE — Patient Instructions (Signed)
Results only. PFT. No exam

## 2014-11-09 NOTE — Progress Notes (Signed)
PFT done today. Karen Molina,CMA  

## 2014-11-12 ENCOUNTER — Telehealth: Payer: Self-pay

## 2014-11-12 MED ORDER — ALBUTEROL SULFATE HFA 108 (90 BASE) MCG/ACT IN AERS: INHALATION_SPRAY | RESPIRATORY_TRACT | Status: DC

## 2014-11-12 MED ORDER — TIOTROPIUM BROMIDE MONOHYDRATE 1.25 MCG/ACT IN AERS: 2.0000 | INHALATION_SPRAY | Freq: Once | RESPIRATORY_TRACT | Status: DC

## 2014-11-12 NOTE — Telephone Encounter (Signed)
Spoke with patient about pulmonary function results per Dr. Eulas Post, call Anna Genre and Proir to Kenneth

## 2014-12-14 ENCOUNTER — Encounter: Payer: Managed Care, Other (non HMO) | Admitting: Internal Medicine

## 2014-12-28 ENCOUNTER — Ambulatory Visit: Payer: Managed Care, Other (non HMO) | Admitting: Internal Medicine

## 2015-01-11 ENCOUNTER — Ambulatory Visit (INDEPENDENT_AMBULATORY_CARE_PROVIDER_SITE_OTHER): Payer: Managed Care, Other (non HMO) | Admitting: Internal Medicine

## 2015-01-11 ENCOUNTER — Encounter: Payer: Self-pay | Admitting: Internal Medicine

## 2015-01-11 VITALS — BP 120/82 | HR 85 | Temp 97.8°F | Resp 20 | Ht 67.0 in | Wt 177.4 lb

## 2015-01-11 DIAGNOSIS — N951 Menopausal and female climacteric states: Secondary | ICD-10-CM | POA: Diagnosis not present

## 2015-01-11 MED ORDER — ESTRADIOL 2 MG PO TABS
2.0000 mg | ORAL_TABLET | Freq: Every day | ORAL | Status: DC
Start: 2015-01-11 — End: 2016-01-31

## 2015-01-11 MED ORDER — PAROXETINE HCL 20 MG PO TABS: 20.0000 mg | ORAL_TABLET | Freq: Every day | ORAL | Status: DC

## 2015-01-11 NOTE — Patient Instructions (Signed)
Increase estradiol and paroxetine to 2 tabs until bottle empty then get new prescription and takes as directed. If no better, may need GYN evaluation  Continue other medications as ordered  Follow up for CPE in 1-2 mos

## 2015-01-11 NOTE — Progress Notes (Signed)
Patient ID: Karen Molina, female   DOB: 05/06/1960, 54 y.o.   MRN: [id number]    Location:     [facility]   Place of Service:  OFFICE   Chief Complaint  Patient presents with  . Medical Management of Chronic Issues    medication questions    HPI:  54 yo female seen today for medication advice. She reports vasomotor sx's uncontrolled and it is interrupting her sleep and causing her to have frequent hot flashes. She states she has been compliant with meds. Currently taking 1mg  estradiol daily and paxil 10mg  daily. She still has a short temper and gets angry quickly. She has vaginal dryness. She has tried OTC herbal preparations  Past Medical History  Diagnosis Date  . Chest pain   . Palpitations   . Insomnia   . Fatigue   . Paresthesias     lower extremities  . H/O: hysterectomy   . Asthma     Past Surgical History  Procedure Laterality Date  . Inguinal hernia repair    . Uterine fibroid surgery    . Abdominal hysterectomy    . Cervical ablation      Patient Care Team: Gildardo Cranker, DO as PCP - General (Internal Medicine)  Social History   Social History  . Marital Status: Divorced    Spouse Name: N/A  . Number of Children: N/A  . Years of Education: N/A   Occupational History  . Not on file.   Social History Main Topics  . Smoking status: Former Smoker -- 0.50 packs/day    Types: Cigarettes  . Smokeless tobacco: Never Used  . Alcohol Use: 2.4 oz/week    4 Cans of beer per week     Comment: < 6 drinks weekly  . Drug Use: No  . Sexual Activity: Not on file   Other Topics Concern  . Not on file   Social History Narrative   Diet: Regular        Do you drink/ eat things with caffeine? No      Marital status:    Single                           What year were you married ?      Do you live in a house, apartment,assistred living, condo, trailer, etc.)? House      Is it one or more stories? 2      How many persons live in your home ? 2      Do you have  any pets in your home ?(please list) No      Current or past profession: Quality Auditor       Do you exercise? Yes                             Type & how often: Walk      Do you have a living will? No      Do you have a DNR form?  No                     If not, do you want to discuss one? Yes      Do you have signed POA?HPOA forms?  No               If so, please bring to your  appointment           reports that she has quit smoking. Her smoking use included Cigarettes. She smoked 0.50 packs per day. She has never used smokeless tobacco. She reports that she drinks about 2.4 oz of alcohol per week. She reports that she does not use illicit drugs.  Allergies  Allergen Reactions  . Influenza Virus Vacc Split Pf Other (See Comments)    "deadly sick "  . Penicillins Hives, Itching and Swelling    Medications: Patient's Medications  New Prescriptions   No medications on file  Previous Medications   ESTRADIOL (ESTRACE) 1 MG TABLET    Take 1 tablet (1 mg total) by mouth daily.   FENOPROFEN (NALFON) 600 MG TABS TABLET    Take 1 tablet (600 mg total) by mouth daily.   IBUPROFEN (ADVIL,MOTRIN) 800 MG TABLET    Take 1 tablet (800 mg total) by mouth every 8 (eight) hours as needed for moderate pain.   PAROXETINE (PAXIL) 10 MG TABLET    Take 1 tablet (10 mg total) by mouth daily.   TIOTROPIUM BROMIDE MONOHYDRATE (SPIRIVA RESPIMAT) 1.25 MCG/ACT AERS    Inhale 2 puffs into the lungs once. daily  Modified Medications   No medications on file  Discontinued Medications   ACETAMINOPHEN (TYLENOL) 325 MG TABLET    Take 650 mg by mouth every 6 (six) hours as needed for fever.   ALBUTEROL (PROAIR HFA) 108 (90 BASE) MCG/ACT INHALER    Every 4-6 hours as needed for difficulty breathing, wheezing or cough    Review of Systems  Endocrine: Positive for heat intolerance.  Psychiatric/Behavioral: Positive for sleep disturbance and dysphoric mood.  All other systems reviewed and are  negative.   Filed Vitals:   01/11/15 1556  BP: 120/82  Pulse: 85  Temp: 97.8 F (36.6 C)  TempSrc: Oral  Resp: 20  Height: 5\' 7"  (1.702 m)  Weight: 177 lb 6.4 oz (80.468 kg)  SpO2: 97%   Body mass index is 27.78 kg/(m^2).  Physical Exam  Constitutional: She is oriented to person, place, and time. She appears well-developed and well-nourished. No distress.  Neurological: She is alert and oriented to person, place, and time.  Skin: Skin is warm and dry. No rash noted.  Psychiatric: Her speech is normal. Judgment and thought content normal. Her mood appears anxious. She is aggressive. Cognition and memory are normal. She does not exhibit a depressed mood.     Labs reviewed:  CBC Latest Ref Rng 10/05/2014 07/02/2014 07/05/2013  WBC 3.4 - 10.8 x10E3/uL 6.2 4.1 6.6  Hemoglobin 12.0 - 15.0 g/dL - 12.3 12.3  Hematocrit 34.0 - 46.6 % 36.6 37.8 37.4  Platelets 150 - 400 K/uL - 258 303    CMP Latest Ref Rng 10/05/2014 07/02/2014 07/05/2013  Glucose 65 - 99 mg/dL 88 119(H) 104(H)  BUN 6 - 24 mg/dL 15 9 16   Creatinine 0.57 - 1.00 mg/dL 0.70 0.89 0.71  Sodium 134 - 144 mmol/L 140 135 137  Potassium 3.5 - 5.2 mmol/L 4.1 2.8(L) 4.5  Chloride 97 - 108 mmol/L 101 99 103  CO2 18 - 29 mmol/L 22 24 27   Calcium 8.7 - 10.2 mg/dL 9.5 8.5 9.8  Total Protein 6.0 - 8.5 g/dL 7.0 7.2 7.3  Total Bilirubin 0.0 - 1.2 mg/dL 0.3 0.4 0.5  Alkaline Phos 39 - 117 IU/L 44 43 52  AST 0 - 40 IU/L 17 26 19   ALT 0 - 32 IU/L 17 18 21  10/05/14  TSH   0.651 IRON  60 FERRITIN  219  Office Visit on 11/09/2014  Component Date Value Ref Range Status  . FVC-Pre 11/09/2014 2.83   Final  . FVC-%Pred-Pre 11/09/2014 93   Final  . FVC-Post 11/09/2014 2.97   Final  . FVC-%Pred-Post 11/09/2014 98   Final  . FVC-%Change-Post 11/09/2014 4   Final  . FEV1-Pre 11/09/2014 1.95   Final  . FEV1-%Pred-Pre 11/09/2014 81   Final  . FEV1-Post 11/09/2014 2.18   Final  . FEV1-%Pred-Post 11/09/2014 90   Final  .  FEV1-%Change-Post 11/09/2014 11   Final  . FEV6-Pre 11/09/2014 2.83   Final  . FEV6-%Pred-Pre 11/09/2014 96   Final  . FEV6-Post 11/09/2014 2.97   Final  . FEV6-%Pred-Post 11/09/2014 101   Final  . FEV6-%Change-Post 11/09/2014 4   Final  . Pre FEV1/FVC ratio 11/09/2014 69   Final  . FEV1FVC-%Pred-Pre 11/09/2014 86   Final  . Post FEV1/FVC ratio 11/09/2014 73   Final  . FEV1FVC-%Change-Post 11/09/2014 6   Final  . Pre FEV6/FVC Ratio 11/09/2014 100   Final  . FEV6FVC-%Pred-Pre 11/09/2014 103   Final  . Post FEV6/FVC ratio 11/09/2014 100   Final  . FEV6FVC-%Pred-Post 11/09/2014 103   Final  . FEF 25-75 Pre 11/09/2014 1.38   Final  . FEF2575-%Pred-Pre 11/09/2014 57   Final  . FEF 25-75 Post 11/09/2014 1.92   Final  . FEF2575-%Pred-Post 11/09/2014 79   Final  . FEF2575-%Change-Post 11/09/2014 39   Final  . DLCO unc 11/09/2014 17.75   Final  . DLCO unc % pred 11/09/2014 65   Final  . DL/VA 11/09/2014 3.99   Final  . DL/VA % pred 11/09/2014 79   Final    No results found.   Assessment/Plan   ICD-9-CM ICD-10-CM   1. Hot flashes, menopausal - failing to change as expected 627.2 N95.1 estradiol (ESTRACE) 2 MG tablet     PARoxetine (PAXIL) 20 MG tablet     Increase estradiol and paroxetine to 2 tabs until bottle empty then get new prescription and takes as directed. If no better, may need GYN evaluation  Continue other medications as ordered  Follow up for CPE in 1-2 mos  Yekaterina Escutia S. Perlie Gold  [facility] and [facility] [address] [place], [place] [address] [phone] Cell (Monday-Friday 8 AM - 5 PM) [phone] After 5 PM and follow prompts

## 2015-04-02 ENCOUNTER — Other Ambulatory Visit: Payer: Self-pay | Admitting: Internal Medicine

## 2015-05-10 ENCOUNTER — Ambulatory Visit (INDEPENDENT_AMBULATORY_CARE_PROVIDER_SITE_OTHER): Payer: Managed Care, Other (non HMO) | Admitting: Internal Medicine

## 2015-05-10 ENCOUNTER — Encounter: Payer: Self-pay | Admitting: Internal Medicine

## 2015-05-10 VITALS — BP 150/82 | HR 85 | Temp 98.1°F | Resp 18 | Ht 67.0 in | Wt 189.4 lb

## 2015-05-10 DIAGNOSIS — R0683 Snoring: Secondary | ICD-10-CM | POA: Diagnosis not present

## 2015-05-10 DIAGNOSIS — R030 Elevated blood-pressure reading, without diagnosis of hypertension: Secondary | ICD-10-CM | POA: Diagnosis not present

## 2015-05-10 DIAGNOSIS — M25562 Pain in left knee: Secondary | ICD-10-CM

## 2015-05-10 DIAGNOSIS — M25561 Pain in right knee: Secondary | ICD-10-CM

## 2015-05-10 DIAGNOSIS — IMO0001 Reserved for inherently not codable concepts without codable children: Secondary | ICD-10-CM

## 2015-05-10 DIAGNOSIS — M7551 Bursitis of right shoulder: Secondary | ICD-10-CM

## 2015-05-10 DIAGNOSIS — J45909 Unspecified asthma, uncomplicated: Secondary | ICD-10-CM

## 2015-05-10 DIAGNOSIS — N951 Menopausal and female climacteric states: Secondary | ICD-10-CM | POA: Diagnosis not present

## 2015-05-10 MED ORDER — METHYLPREDNISOLONE 4 MG PO TBPK: ORAL_TABLET | ORAL | Status: DC

## 2015-05-10 MED ORDER — HYDROCODONE-ACETAMINOPHEN 5-325 MG PO TABS: 1.0000 | ORAL_TABLET | Freq: Four times a day (QID) | ORAL | Status: DC | PRN

## 2015-05-10 MED ORDER — FENOPROFEN CALCIUM 600 MG PO TABS: ORAL_TABLET | ORAL | Status: DC

## 2015-05-10 NOTE — Patient Instructions (Addendum)
Take all of steroid as directed  Apply warm compress to right shoulder at least 3 times daily  Continue other medications as ordered  Will call with sleep study appt  May need orthopedic referral if shoulder pain does not improve after steroid  Follow up in 3 mos for routine visit

## 2015-05-10 NOTE — Progress Notes (Signed)
Patient ID: Karen Molina, female   DOB: 27-May-1960, 55 y.o.   MRN: [id number]    Location:    [facility]   Place of Service:  OFFICE   Chief Complaint  Patient presents with  . Medical Management of Chronic Issues    routine follow-up , Patients says she has pain in her right shoulder and says she has been told that she stops breathing during sleep wants to discuss    HPI:  55 yo female seen today for f/u. She c/o witnessed apnea while sleeping. She snores. PFTs done in 2016 revealed obstruction and takes spiriva. She has never had a sleep study.  Joint pain - she c/o knee pain since being out of anti-inflammatory. She now has right shoulder pain and cannot raise arm above head due to pain. No known trauma. No relief with ibuprofen  Vasomotor sx's - hot flashes slightly improved on estradiol and paroxetine  Asthma - stable on spiriva  Past Medical History  Diagnosis Date  . Chest pain   . Palpitations   . Insomnia   . Fatigue   . Paresthesias     lower extremities  . H/O: hysterectomy   . Asthma     Past Surgical History  Procedure Laterality Date  . Inguinal hernia repair    . Uterine fibroid surgery    . Abdominal hysterectomy    . Cervical ablation      Patient Care Team: Gildardo Cranker, DO as PCP - General (Internal Medicine)  Social History   Social History  . Marital Status: Divorced    Spouse Name: N/A  . Number of Children: N/A  . Years of Education: N/A   Occupational History  . Not on file.   Social History Main Topics  . Smoking status: Former Smoker -- 0.50 packs/day    Types: Cigarettes  . Smokeless tobacco: Never Used  . Alcohol Use: 2.4 oz/week    4 Cans of beer per week     Comment: < 6 drinks weekly  . Drug Use: No  . Sexual Activity: Not on file   Other Topics Concern  . Not on file   Social History Narrative   Diet: Regular        Do you drink/ eat things with caffeine? No      Marital status:    Single                            What year were you married ?      Do you live in a house, apartment,assistred living, condo, trailer, etc.)? House      Is it one or more stories? 2      How many persons live in your home ? 2      Do you have any pets in your home ?(please list) No      Current or past profession: Quality Auditor       Do you exercise? Yes                             Type & how often: Walk      Do you have a living will? No      Do you have a DNR form?  No                     If not, do you want  to discuss one? Yes      Do you have signed POA?HPOA forms?  No               If so, please bring to your        appointment           reports that she has quit smoking. Her smoking use included Cigarettes. She smoked 0.50 packs per day. She has never used smokeless tobacco. She reports that she drinks about 2.4 oz of alcohol per week. She reports that she does not use illicit drugs.  Allergies  Allergen Reactions  . Influenza Virus Vacc Split Pf Other (See Comments)    "deadly sick "  . Penicillins Hives, Itching and Swelling    Medications: Patient's Medications  New Prescriptions   No medications on file  Previous Medications   ESTRADIOL (ESTRACE) 2 MG TABLET    Take 1 tablet (2 mg total) by mouth daily.   FENOPROFEN (NALFON) 600 MG TABS TABLET    TAKE 1 TABLET (600 MG TOTAL) BY MOUTH DAILY.   IBUPROFEN (ADVIL,MOTRIN) 800 MG TABLET    Take 1 tablet (800 mg total) by mouth every 8 (eight) hours as needed for moderate pain.   PAROXETINE (PAXIL) 20 MG TABLET    Take 1 tablet (20 mg total) by mouth daily.   TIOTROPIUM BROMIDE MONOHYDRATE (SPIRIVA RESPIMAT) 1.25 MCG/ACT AERS    Inhale 2 puffs into the lungs once. daily  Modified Medications   No medications on file  Discontinued Medications   No medications on file    Review of Systems  Constitutional: Positive for fatigue.  Musculoskeletal: Positive for joint swelling and arthralgias.  Neurological: Positive for weakness.    Psychiatric/Behavioral: Positive for sleep disturbance and dysphoric mood.  All other systems reviewed and are negative.   Filed Vitals:   05/10/15 1605  BP: 150/82  Pulse: 85  Temp: 98.1 F (36.7 C)  TempSrc: Oral  Resp: 18  Height: 5\' 7"  (1.702 m)  Weight: 189 lb 6.4 oz (85.911 kg)  SpO2: 98%   Body mass index is 29.66 kg/(m^2).  Physical Exam  Constitutional: She is oriented to person, place, and time. She appears well-developed and well-nourished.  HENT:  Mouth/Throat: Oropharynx is clear and moist. No oropharyngeal exudate.  Eyes: Pupils are equal, round, and reactive to light. No scleral icterus.  Neck: Neck supple. Carotid bruit is not present. No tracheal deviation present. No thyromegaly present.  Cardiovascular: Normal rate, regular rhythm, normal heart sounds and intact distal pulses.  Exam reveals no gallop and no friction rub.   No murmur heard. No LE edema b/l. no calf TTP.   Pulmonary/Chest: Effort normal and breath sounds normal. No stridor. No respiratory distress. She has no wheezes. She has no rales.  Abdominal: Soft. Bowel sounds are normal. She exhibits no distension and no mass. There is no hepatomegaly. There is no tenderness. There is no rebound and no guarding.  Musculoskeletal: She exhibits edema and tenderness.       Right shoulder: She exhibits decreased range of motion, tenderness, swelling, pain and spasm. She exhibits no bony tenderness, no effusion and no crepitus.       Arms: Lymphadenopathy:    She has no cervical adenopathy.  Neurological: She is alert and oriented to person, place, and time.  Skin: Skin is warm and dry. No rash noted.  Psychiatric: Her behavior is normal. Her affect is blunt.     Labs reviewed: No visits with  results within 3 Month(s) from this visit. Latest known visit with results is:  Office Visit on 11/09/2014  Component Date Value Ref Range Status  . FVC-Pre 11/09/2014 2.83   Final  . FVC-%Pred-Pre 11/09/2014  93   Final  . FVC-Post 11/09/2014 2.97   Final  . FVC-%Pred-Post 11/09/2014 98   Final  . FVC-%Change-Post 11/09/2014 4   Final  . FEV1-Pre 11/09/2014 1.95   Final  . FEV1-%Pred-Pre 11/09/2014 81   Final  . FEV1-Post 11/09/2014 2.18   Final  . FEV1-%Pred-Post 11/09/2014 90   Final  . FEV1-%Change-Post 11/09/2014 11   Final  . FEV6-Pre 11/09/2014 2.83   Final  . FEV6-%Pred-Pre 11/09/2014 96   Final  . FEV6-Post 11/09/2014 2.97   Final  . FEV6-%Pred-Post 11/09/2014 101   Final  . FEV6-%Change-Post 11/09/2014 4   Final  . Pre FEV1/FVC ratio 11/09/2014 69   Final  . FEV1FVC-%Pred-Pre 11/09/2014 86   Final  . Post FEV1/FVC ratio 11/09/2014 73   Final  . FEV1FVC-%Change-Post 11/09/2014 6   Final  . Pre FEV6/FVC Ratio 11/09/2014 100   Final  . FEV6FVC-%Pred-Pre 11/09/2014 103   Final  . Post FEV6/FVC ratio 11/09/2014 100   Final  . FEV6FVC-%Pred-Post 11/09/2014 103   Final  . FEF 25-75 Pre 11/09/2014 1.38   Final  . FEF2575-%Pred-Pre 11/09/2014 57   Final  . FEF 25-75 Post 11/09/2014 1.92   Final  . FEF2575-%Pred-Post 11/09/2014 79   Final  . FEF2575-%Change-Post 11/09/2014 39   Final  . DLCO unc 11/09/2014 17.75   Final  . DLCO unc % pred 11/09/2014 65   Final  . DL/VA 11/09/2014 3.99   Final  . DL/VA % pred 11/09/2014 79   Final    No results found.   Assessment/Plan   ICD-9-CM ICD-10-CM   1. Snoring 786.09 R06.83 Nocturnal polysomnography (NPSG)  2. Subacromial bursitis, right 726.19 M75.51 methylPREDNISolone (MEDROL DOSEPAK) 4 MG TBPK tablet     HYDROcodone-acetaminophen (NORCO) 5-325 MG tablet  3. Extrinsic asthma, unspecified asthma severity, uncomplicated [id number] [id number]   4. Hot flashes, menopausal 627.2 N95.1   5. Bilateral knee pain 719.46 M25.561 fenoprofen (NALFON) 600 MG TABS tablet    M25.562   6.      Elevated BP - due to pain. Reck next OV  Take all of steroid as directed  Apply warm compress to right shoulder at least 3 times daily  Continue other  medications as ordered  Will call with sleep study appt  May need orthopedic referral if shoulder pain does not improve after steroid  Follow up in 3 mos for routine visit  Tarez Bowns S. Perlie Gold  [facility] [address] [place], [place] [address] [phone] Cell (Monday-Friday 8 AM - 5 PM) [phone] After 5 PM and follow prompts

## 2015-05-22 ENCOUNTER — Other Ambulatory Visit: Payer: Self-pay | Admitting: Internal Medicine

## 2015-05-22 DIAGNOSIS — M7551 Bursitis of right shoulder: Secondary | ICD-10-CM

## 2015-05-28 NOTE — Addendum Note (Signed)
Addended by: Logan Bores on: 05/28/2015 04:56 PM   Modules accepted: Orders

## 2015-06-17 ENCOUNTER — Encounter: Payer: Self-pay | Admitting: Neurology

## 2015-06-17 ENCOUNTER — Ambulatory Visit (INDEPENDENT_AMBULATORY_CARE_PROVIDER_SITE_OTHER): Payer: Managed Care, Other (non HMO) | Admitting: Neurology

## 2015-06-17 VITALS — BP 134/89 | HR 99 | Resp 20 | Ht 67.0 in | Wt 182.0 lb

## 2015-06-17 DIAGNOSIS — R0683 Snoring: Secondary | ICD-10-CM

## 2015-06-17 DIAGNOSIS — G4719 Other hypersomnia: Secondary | ICD-10-CM | POA: Diagnosis not present

## 2015-06-17 DIAGNOSIS — R351 Nocturia: Secondary | ICD-10-CM | POA: Diagnosis not present

## 2015-06-17 DIAGNOSIS — E669 Obesity, unspecified: Secondary | ICD-10-CM | POA: Diagnosis not present

## 2015-06-17 DIAGNOSIS — G2581 Restless legs syndrome: Secondary | ICD-10-CM | POA: Diagnosis not present

## 2015-06-17 DIAGNOSIS — R0681 Apnea, not elsewhere classified: Secondary | ICD-10-CM

## 2015-06-17 NOTE — Progress Notes (Signed)
Subjective:    Patient ID: Karen Molina is a 55 y.o. female.  HPI     Star Age, MD, PhD [facility] [address] [place], [place] [address]  Dear Dr. Eulas Post,   I saw your patient, Karen Molina, upon your kind request in my neurologic clinic today for initial consultation of her sleep disorder, in particular, concern for underlying obstructive sleep apnea. The patient is unaccompanied today. As you know, Karen Molina is a 55 year old right-handed woman with an underlying medical history of asthma, chest pain, palpitations, and overweight state, who reports snoring and excessive daytime somnolence as well as witnessed breathing pauses while asleep, per family. She lives with her 50 yo son (she has a 35 yo daughter as well).  She works from 8 to sometimes 8 PM. She is currently restricted to 8 hours. She has been having some nasal congestion, some drainage lately, feels like she is coming down with the cold.  Typical bedtime is between 8:30 and 9 PM. She does not have trouble going to sleep but has trouble staying asleep and reports multiple nighttime awakenings and nocturia about 4-5 times each night. She denies morning headaches. She had restless leg symptoms in the past but not recently. She has no family history of RLS. Her Epworth sleepiness score is 12 out of 24 today, her fatigue score is 20 out of 63. She quit smoking in 2000, quit smoking marijuana in 2005, drinks alcohol occasionally, in the form of wine on weekends. She does not drink caffeine on a daily basis.  I reviewed your office note from 05/10/2015.  She had PFTs on 11/09/2014 and I reviewed the test results: There was evidence of mild obstruction. She was advised to start Spiriva.  She had asthma as a child, no FHx of OSA.   Her Past Medical History Is Significant For: Past Medical History  Diagnosis Date  . Chest pain   . Palpitations   . Insomnia   . Fatigue   .  Paresthesias     lower extremities  . H/O: hysterectomy   . Asthma   . Cervical cancer [facility])     Her Past Surgical History Is Significant For: Past Surgical History  Procedure Laterality Date  . Inguinal hernia repair    . Uterine fibroid surgery    . Abdominal hysterectomy    . Cervical ablation      Her Family History Is Significant For: Family History  Problem Relation Age of Onset  . Hypertension Mother   . Diabetes Mother   . Osteoarthritis Mother   . Stroke Maternal Grandmother   . Diabetes Maternal Grandmother   . Diabetes Brother   . Hypertension Brother   . HIV/AIDS Brother   . Drug abuse Brother   . Lupus Sister   . Arthritis Sister   . Breast cancer Daughter   . Asthma Father   . Cancer Paternal Grandfather     Her Social History Is Significant For: Social History   Social History  . Marital Status: Divorced    Spouse Name: N/A  . Number of Children: 2  . Years of Education: College   Occupational History  . [employer]     Social History Main Topics  . Smoking status: Former Smoker -- 0.50 packs/day    Types: Cigarettes  . Smokeless tobacco: Never Used     Comment: Stopped 2000  . Alcohol Use: 2.4 oz/week    4 Cans of  beer per week     Comment: wine on weekends  . Drug Use: No     Comment: Quit 03/2003  . Sexual Activity: Not Asked   Other Topics Concern  . None   Social History Narrative   Diet: Regular        Do you drink/ eat things with caffeine? No      Marital status:    Single                           What year were you married ?      Do you live in a house, apartment,assistred living, condo, trailer, etc.)? House      Is it one or more stories? 2      How many persons live in your home ? 2      Do you have any pets in your home ?(please list) No      Current or past profession: Quality Auditor       Do you exercise? Yes                             Type & how often: Walk      Do you have a living will? No      Do you  have a DNR form?  No                     If not, do you want to discuss one? Yes      Do you have signed POA?HPOA forms?  No               If so, please bring to your        appointment   Denies caffeine use        Her Allergies Are:  Allergies  Allergen Reactions  . Influenza Virus Vacc Split Pf Other (See Comments)    "deadly sick "  . Penicillins Hives, Itching and Swelling  :   Her Current Medications Are:  Outpatient Encounter Prescriptions as of 06/17/2015  Medication Sig  . albuterol (PROVENTIL HFA;VENTOLIN HFA) 108 (90 Base) MCG/ACT inhaler Inhale 2 puffs into the lungs every 6 (six) hours as needed for wheezing or shortness of breath.  . estradiol (ESTRACE) 2 MG tablet Take 1 tablet (2 mg total) by mouth daily.  . fenoprofen (NALFON) 600 MG TABS tablet TAKE 1 TABLET (600 MG TOTAL) BY MOUTH DAILY.  [name] HYDROcodone-acetaminophen (NORCO) 5-325 MG tablet Take 1 tablet by mouth every 6 (six) hours as needed for moderate pain.  [name] ibuprofen (ADVIL,MOTRIN) 800 MG tablet Take 1 tablet (800 mg total) by mouth every 8 (eight) hours as needed for moderate pain.  . methylPREDNISolone (MEDROL DOSEPAK) 4 MG TBPK tablet Use as directed  . PARoxetine (PAXIL) 20 MG tablet Take 1 tablet (20 mg total) by mouth daily.  . Tiotropium Bromide Monohydrate (SPIRIVA RESPIMAT) 1.25 MCG/ACT AERS Inhale 2 puffs into the lungs once. daily   No facility-administered encounter medications on file as of 06/17/2015.  :  Review of Systems:  Out of a complete 14 point review of systems, all are reviewed and negative with the exception of these symptoms as listed below:   Review of Systems  Neurological:       Patient reports trouble staying asleep, snoring, witnessed apnea, wakes up feeling tired, as day progresses patient feels more tired,  denies taking naps.    Epworth Sleepiness Scale 0= would never doze 1= slight chance of dozing 2= moderate chance of dozing 3= high chance of dozing  Sitting and  reading:0 Watching TV:3 Sitting inactive in a public place (ex. Theater or meeting):0 As a passenger in a car for an hour without a break:3 Lying down to rest in the afternoon:3 Sitting and talking to someone:1 Sitting quietly after lunch (no alcohol):2 In a car, while stopped in traffic:0 Total:12   Objective:  Neurologic Exam  Physical Exam Physical Examination:   Filed Vitals:   06/17/15 1533  BP: 134/89  Pulse: 99  Resp: 20    General Examination: The patient is a very pleasant 55 y.o. female in no acute distress. She appears well-developed and well-nourished and well groomed.   HEENT: Normocephalic, atraumatic, pupils are equal, round and reactive to light and accommodation. Funduscopic exam is normal with sharp disc margins noted. Extraocular tracking is good without limitation to gaze excursion or nystagmus noted. Normal smooth pursuit is noted. Hearing is grossly intact. Tympanic membranes are clear bilaterally. Face is symmetric with normal facial animation and normal facial sensation. Speech is clear with no dysarthria noted. There is no hypophonia. There is no lip, neck/head, jaw or voice tremor. Neck is supple with full range of passive and active motion. There are no carotid bruits on auscultation. Oropharynx exam reveals: mild mouth dryness,Mild pharyngeal erythema, adequate dental hygiene, moderate airway crowding, secondary to smaller airway entry, redundant soft palate and tonsils in place, about 1+ bilaterally. Mallampati is class II. Tongue protrudes centrally and palate elevates symmetrically. Neck size is 14 inches. She has a Mild overbite.   Chest: Clear to auscultation without wheezing, rhonchi or crackles noted.  Heart: S1+S2+0, regular and normal without murmurs, rubs or gallops noted.   Abdomen: Soft, non-tender and non-distended with normal bowel sounds appreciated on auscultation.  Extremities: There is no pitting edema in the distal lower extremities  bilaterally. Pedal pulses are intact.  Skin: Warm and dry without trophic changes noted.   Musculoskeletal: exam reveals no obvious joint deformities, tenderness or joint swelling or erythema, with the exception that she does report some right-sided neck pain and shoulder pain.   Neurologically:  Mental status: The patient is awake, alert and oriented in all 4 spheres. Her immediate and remote memory, attention, language skills and fund of knowledge are appropriate. There is no evidence of aphasia, agnosia, apraxia or anomia. Speech is clear with normal prosody and enunciation. Thought process is linear. Mood is normal and affect is normal.  Cranial nerves II - XII are as described above under HEENT exam. In addition: shoulder shrug is normal with equal shoulder height noted. Motor exam: Normal bulk, strength and tone is noted. There is no drift, tremor or rebound. Romberg is negative. Reflexes are 2+ throughout. Fine motor skills and coordination: intact with normal finger taps, normal hand movements, normal rapid alternating patting, normal foot taps and normal foot agility.  Cerebellar testing: No dysmetria or intention tremor on finger to nose testing. Heel to shin is unremarkable bilaterally. There is no truncal or gait ataxia.  Sensory exam: intact to light touch, pinprick, vibration, temperature sense in the upper and lower extremities.  Gait, station and balance: She stands easily. No veering to one side is noted. No leaning to one side is noted. Posture is age-appropriate and stance is narrow based. Gait shows normal stride length and normal pace. No problems turning are noted. She turns en  bloc. Tandem walk is unremarkable. Intact toe and heel stance is noted.               Assessment and plan:   In summary, Karen Molina is a very pleasant 55 y.o.-year old female with an underlying medical history of asthma, chest pain, palpitations, and overweight state, whose history and physical exam  are indeed concerning for obstructive sleep apnea (OSA). I had a long chat with the patient about my findings and the diagnosis of OSA, its prognosis and treatment options. We talked about medical treatments, surgical interventions and non-pharmacological approaches. I explained in particular the risks and ramifications of untreated moderate to severe OSA, especially with respect to developing cardiovascular disease down the Road, including congestive heart failure, difficult to treat hypertension, cardiac arrhythmias, or stroke. Even type 2 diabetes has, in part, been linked to untreated OSA. Symptoms of untreated OSA include daytime sleepiness, memory problems, mood irritability and mood disorder such as depression and anxiety, lack of energy, as well as recurrent headaches, especially morning headaches. We talked about trying to maintain a healthy lifestyle in general, as well as the importance of weight control. I encouraged the patient to eat healthy, exercise daily and keep well hydrated, to keep a scheduled bedtime and wake time routine, to not skip any meals and eat healthy snacks in between meals. I advised the patient not to drive when feeling sleepy. I recommended the following at this time: sleep study with potential positive airway pressure titration. (We will score hypopneas at 4% and split the sleep study into diagnostic and treatment portion, if the estimated. 2 hour AHI is >15/h).   I explained the sleep test procedure to the patient and also outlined possible surgical and non-surgical treatment options of OSA, including the use of a custom-made dental device (which would require a referral to a specialist dentist or oral surgeon), upper airway surgical options, such as pillar implants, radiofrequency surgery, tongue base surgery, and UPPP (which would involve a referral to an ENT surgeon). Rarely, jaw surgery such as mandibular advancement may be considered.  I also explained the CPAP  treatment option to the patient, who indicated that she would be willing to try CPAP if the need arises. I explained the importance of being compliant with PAP treatment, not only for insurance purposes but primarily to improve Her symptoms, and for the patient's long term health benefit, including to reduce Her cardiovascular risks. I answered all her questions today and the patient was in agreement. I would like to see her back after the sleep study is completed and encouraged her to call with any interim questions, concerns, problems or updates.   Thank you very much for allowing me to participate in the care of this nice patient. If I can be of any further assistance to you please do not hesitate to call me at [phone].  Sincerely,   Star Age, MD, PhD

## 2015-06-17 NOTE — Patient Instructions (Addendum)

## 2015-06-18 ENCOUNTER — Encounter: Payer: Self-pay | Admitting: *Deleted

## 2015-08-01 ENCOUNTER — Other Ambulatory Visit: Payer: Self-pay | Admitting: *Deleted

## 2015-08-01 DIAGNOSIS — M25562 Pain in left knee: Principal | ICD-10-CM

## 2015-08-01 DIAGNOSIS — M25561 Pain in right knee: Secondary | ICD-10-CM

## 2015-08-01 MED ORDER — IBUPROFEN 800 MG PO TABS: 800.0000 mg | ORAL_TABLET | Freq: Three times a day (TID) | ORAL | Status: DC | PRN

## 2015-08-09 ENCOUNTER — Ambulatory Visit: Payer: Managed Care, Other (non HMO) | Admitting: Internal Medicine

## 2015-08-22 ENCOUNTER — Ambulatory Visit (INDEPENDENT_AMBULATORY_CARE_PROVIDER_SITE_OTHER): Payer: Managed Care, Other (non HMO) | Admitting: Neurology

## 2015-08-22 DIAGNOSIS — G4734 Idiopathic sleep related nonobstructive alveolar hypoventilation: Secondary | ICD-10-CM

## 2015-08-22 DIAGNOSIS — G4733 Obstructive sleep apnea (adult) (pediatric): Secondary | ICD-10-CM

## 2015-08-22 DIAGNOSIS — G4720 Circadian rhythm sleep disorder, unspecified type: Secondary | ICD-10-CM

## 2015-09-03 ENCOUNTER — Telehealth: Payer: Self-pay | Admitting: Neurology

## 2015-09-03 DIAGNOSIS — G4720 Circadian rhythm sleep disorder, unspecified type: Secondary | ICD-10-CM

## 2015-09-03 DIAGNOSIS — G4733 Obstructive sleep apnea (adult) (pediatric): Secondary | ICD-10-CM

## 2015-09-03 NOTE — Telephone Encounter (Signed)
Patient referred by Dr. Eulas Post, seen by me on 06/17/15, diagnostic PSG on 08/22/15, ins: [employer].   Please call and notify the patient that the recent sleep study did confirm the diagnosis of significant obstructive sleep apnea and that I recommend treatment for this in the form of CPAP. This will require a repeat sleep study for proper titration and mask fitting. Please explain to patient and arrange for a CPAP titration study. I have placed an order in the chart. Thanks, and please route to [facility] for scheduling next sleep study.  Star Age, MD, PhD [facility])

## 2015-09-04 NOTE — Telephone Encounter (Signed)
Patient called back and I gave results and recommendations. Patient is willing to proceed with titration study. Copy of report sent to PCP.

## 2015-09-04 NOTE — Telephone Encounter (Signed)
LM for patient to call back.

## 2015-09-16 ENCOUNTER — Ambulatory Visit (INDEPENDENT_AMBULATORY_CARE_PROVIDER_SITE_OTHER): Payer: Managed Care, Other (non HMO) | Admitting: Neurology

## 2015-09-16 DIAGNOSIS — G4720 Circadian rhythm sleep disorder, unspecified type: Secondary | ICD-10-CM

## 2015-09-16 DIAGNOSIS — G4733 Obstructive sleep apnea (adult) (pediatric): Secondary | ICD-10-CM

## 2015-09-20 ENCOUNTER — Telehealth: Payer: Self-pay | Admitting: Neurology

## 2015-09-20 DIAGNOSIS — G4733 Obstructive sleep apnea (adult) (pediatric): Secondary | ICD-10-CM

## 2015-09-20 NOTE — Telephone Encounter (Signed)
Patient referred by Dr. Eulas Post, seen by me on 06/17/15, diagnostic PSG on 08/22/15, cpap study on 09/16/15, ins: [employer].  Please call and inform patient that I have entered an order for treatment with positive airway pressure (PAP) treatment of obstructive sleep apnea (OSA). She did well during the latest sleep study with CPAP. We will, therefore, arrange for a machine for home use through a DME (durable medical equipment) company of Her choice; and I will see the patient back in follow-up in about 8-10 weeks. Please also explain to the patient that I will be looking out for compliance data, which can be downloaded from the machine (stored on an SD card, that is inserted in the machine) or via remote access through a modem, that is built into the machine. At the time of the followup appointment we will discuss sleep study results and how it is going with PAP treatment at home. Please advise patient to bring Her machine at the time of the first FU visit, even though this is cumbersome. Bringing the machine for every visit after that will likely not be needed, but often helps for the first visit to troubleshoot if needed. Please re-enforce the importance of compliance with treatment and the need for [place] to monitor compliance data - often an insurance requirement and actually good feedback for the patient as far as how they are doing.  Also remind patient, that any interim PAP machine or mask issues should be first addressed with the DME company, as they can often help better with technical and mask fit issues. Please ask if patient has a preference regarding DME company.  Please also make sure, the patient has a follow-up appointment with me in about 8-10 weeks from the setup date, thanks.  Once you have spoken to the patient - and faxed/routed report to PCP and referring MD (if other than PCP), you can close this encounter, thanks,   Star Age, MD, PhD [facility] ([place])

## 2015-09-23 NOTE — Telephone Encounter (Signed)
I spoke to patient and she is aware of results and recommendations. She is willing to proceed with treatment. I will send orders to [facility]. I will send report to PCP. Patient will get a letter reminding her to make f/u appt and stress the importance of compliance.

## 2015-10-13 ENCOUNTER — Other Ambulatory Visit: Payer: Self-pay | Admitting: Internal Medicine

## 2015-10-13 DIAGNOSIS — N951 Menopausal and female climacteric states: Secondary | ICD-10-CM

## 2015-11-27 ENCOUNTER — Encounter: Payer: Self-pay | Admitting: Neurology

## 2015-11-27 ENCOUNTER — Ambulatory Visit (INDEPENDENT_AMBULATORY_CARE_PROVIDER_SITE_OTHER): Payer: Managed Care, Other (non HMO) | Admitting: Neurology

## 2015-11-27 ENCOUNTER — Encounter (INDEPENDENT_AMBULATORY_CARE_PROVIDER_SITE_OTHER): Payer: Self-pay

## 2015-11-27 VITALS — BP 158/88 | HR 84 | Resp 18 | Ht 67.0 in | Wt 188.0 lb

## 2015-11-27 DIAGNOSIS — G4733 Obstructive sleep apnea (adult) (pediatric): Secondary | ICD-10-CM

## 2015-11-27 DIAGNOSIS — Z9989 Dependence on other enabling machines and devices: Principal | ICD-10-CM

## 2015-11-27 NOTE — Progress Notes (Signed)
Subjective:    Patient ID: Karen Molina is a 55 y.o. female.  HPI     Interim history:   Ms. Karen Molina is a 55 year old right-handed woman with an underlying medical history of asthma, chest pain, palpitations, and overweight state, who presents for follow-up consultation of her obstructive sleep apnea, after her recent sleep studies. The patient is unaccompanied today. Her first met her on 06/17/2015 at the request of her primary care physician, at which time she reported snoring and excessive daytime somnolence, as well as witnessed breathing pauses while asleep per family. I invited her back for sleep study. She had a baseline sleep study, followed by a CPAP titration study. I went over her test results with her in detail today. Her baseline sleep study from 08/22/2015 showed a sleep efficiency of 78.3% with a latency to sleep of 27 minutes and wake after sleep onset of 59.5 minutes with moderate to severe sleep fragmentation noted. She had an elevated arousal index. She had an increased percentage of stage I and stage II sleep, absence of slow-wave sleep and absence of REM sleep. She had no significant PLMS, EKG or EEG changes. She had snoring and the supine and lateral positions, overall AHI was 28.7 per hour, average oxygen saturation 91%, nadir was 77%. Based on her test results and her sleep-related complaints I invited her back for a full night CPAP titration study. She had this on 09/16/2015. Sleep efficiency was 88.8% with a latency to sleep of 7 minutes and wake after sleep onset of 37 minutes with mild to moderate sleep fragmentation noted. She had an increased percentage of stage II sleep, absence of slow-wave sleep and REM sleep was 15.4% with a mildly prolonged REM latency of 125 minutes. She had no significant PLMS, EKG or EEG changes. Average oxygen saturation was 97%, nadir was 83%. She was titrated on CPAP from 5 cm to 16 cm. AHI was 0 per hour at the final pressure during non-REM sleep.  She had REM sleep on the pressure of 14 cm with the residual AHI of 7.2 per hour. Based on her test results I prescribed CPAP therapy at a pressure of 16 cm via full facemask.  Today, 11/27/2015: I reviewed her CPAP compliance data from 10/26/2015 through 11/24/2015 which is a total of 30 days, during which time she used her machine 28 days with percent used days greater than 4 hours at 93%, indicating excellent compliance with an average usage for all days of 7 hours and 4 minutes, residual AHI 1 per hour, leak at times high with the 95th percentile at 23.7 L/m on a pressure of 16 cm with EPR of 3.  Today, 11/27/2015: She reports doing well, compliant with treatment, feels a little better with her sleep, less sluggish. Nocturia is a little less. Dreams more. Unfortunately, she has had night sweats with hot flashes and her FFM tends to leak and dislodge. Per BF, she no longer snores. She was treated for left shoulder pain recently. She was given prednisone as well his pain medication. She finished the prednisone and is no longer on narcotic pain medication.  Previously:  06/17/2015: She reports snoring and excessive daytime somnolence as well as witnessed breathing pauses while asleep, per family. She lives with her 62 yo son (she has a 26 yo daughter as well).  She works from 8 AM to sometimes 8 PM. She is currently restricted to 8 hours. She has been having some nasal congestion, some drainage lately, feels  like she is coming down with the cold.  Typical bedtime is between 8:30 and 9 PM. She does not have trouble going to sleep but has trouble staying asleep and reports multiple nighttime awakenings and nocturia about 4-5 times each night. She denies morning headaches. She had restless leg symptoms in the past but not recently. She has no family history of RLS. Her Epworth sleepiness score is 12 out of 24 today, her fatigue score is 20 out of 63. She quit smoking in 2000, quit smoking marijuana in 2005,  drinks alcohol occasionally, in the form of wine on weekends. She does not drink caffeine on a daily basis.   I reviewed your office note from 05/10/2015.  She had PFTs on 11/09/2014 and I reviewed the test results: There was evidence of mild obstruction. She was advised to start Spiriva.   She had asthma as a child, no FHx of OSA.     Her Past Medical History Is Significant For: Past Medical History:  Diagnosis Date  . Asthma   . Cervical cancer ([place])   . Chest pain   . Fatigue   . H/O: hysterectomy   . Insomnia   . Palpitations   . Paresthesias    lower extremities    Her Past Surgical History Is Significant For: Past Surgical History:  Procedure Laterality Date  . ABDOMINAL HYSTERECTOMY    . CERVICAL ABLATION    . INGUINAL HERNIA REPAIR    . UTERINE FIBROID SURGERY      Her Family History Is Significant For: Family History  Problem Relation Age of Onset  . Hypertension Mother   . Diabetes Mother   . Osteoarthritis Mother   . Stroke Maternal Grandmother   . Diabetes Maternal Grandmother   . Diabetes Brother   . Hypertension Brother   . HIV/AIDS Brother   . Drug abuse Brother   . Lupus Sister   . Arthritis Sister   . Breast cancer Daughter   . Asthma Father   . Cancer Paternal Grandfather     Her Social History Is Significant For: Social History   Social History  . Marital status: Divorced    Spouse name: N/A  . Number of children: 2  . Years of education: College   Occupational History  . [employer]     Social History Main Topics  . Smoking status: Former Smoker    Packs/day: 0.50    Types: Cigarettes  . Smokeless tobacco: Never Used     Comment: Stopped 2000  . Alcohol use 2.4 oz/week    4 Cans of beer per week     Comment: wine on weekends  . Drug use: No     Comment: Quit 03/2003  . Sexual activity: Not Asked   Other Topics Concern  . None   Social History Narrative   Diet: Regular        Do you drink/ eat things with caffeine? No       Marital status:    Single                           What year were you married ?      Do you live in a house, apartment,assistred living, condo, trailer, etc.)? House      Is it one or more stories? 2      How many persons live in your home ? 2      Do you have  any pets in your home ?(please list) No      Current or past profession: Quality Auditor       Do you exercise? Yes                             Type & how often: Walk      Do you have a living will? No      Do you have a DNR form?  No                     If not, do you want to discuss one? Yes      Do you have signed POA?HPOA forms?  No               If so, please bring to your        appointment   Denies caffeine use        Her Allergies Are:  Allergies  Allergen Reactions  . Influenza Virus Vacc Split Pf Other (See Comments)    "deadly sick "  . Penicillins Hives, Itching and Swelling  :   Her Current Medications Are:  Outpatient Encounter Prescriptions as of 11/27/2015  Medication Sig  . albuterol (PROVENTIL HFA;VENTOLIN HFA) 108 (90 Base) MCG/ACT inhaler Inhale 2 puffs into the lungs every 6 (six) hours as needed for wheezing or shortness of breath.  . estradiol (ESTRACE) 2 MG tablet Take 1 tablet (2 mg total) by mouth daily.  . fenoprofen (NALFON) 600 MG TABS tablet TAKE 1 TABLET (600 MG TOTAL) BY MOUTH DAILY.  [name] ibuprofen (ADVIL,MOTRIN) 800 MG tablet Take 1 tablet (800 mg total) by mouth every 8 (eight) hours as needed for moderate pain.  [name] PARoxetine (PAXIL) 20 MG tablet Take 1 tablet (20 mg total) by mouth daily.  . Tiotropium Bromide Monohydrate (SPIRIVA RESPIMAT) 1.25 MCG/ACT AERS Inhale 2 puffs into the lungs once. daily  . [DISCONTINUED] HYDROcodone-acetaminophen (NORCO) 5-325 MG tablet Take 1 tablet by mouth every 6 (six) hours as needed for moderate pain.  . [DISCONTINUED] methylPREDNISolone (MEDROL DOSEPAK) 4 MG TBPK tablet Use as directed   No facility-administered encounter medications on file  as of 11/27/2015.   :  Review of Systems:  Out of a complete 14 point review of systems, all are reviewed and negative with the exception of these symptoms as listed below: Review of Systems  Neurological:       Patient states that she is doing well with CPAP. Needs a new order for a mask.  She would like to discuss recent trouble with night sweats and keeping the CPAP mask on.     Objective:  Neurologic Exam  Physical Exam Physical Examination:   Vitals:   11/27/15 1554  BP: (!) 158/88  Pulse: 84  Resp: 18    General Examination: The patient is a very pleasant 55 y.o. female in no acute distress. She appears well-developed and well-nourished and well groomed.   HEENT: Normocephalic, atraumatic, pupils are equal, round and reactive to light and accommodation. Extraocular tracking is good without limitation to gaze excursion or nystagmus noted. Normal smooth pursuit is noted. Hearing is grossly intact. Face is symmetric with normal facial animation and normal facial sensation. Speech is mildly congested, no dysarthria noted. There is no hypophonia. There is no lip, neck/head, jaw or voice tremor. Neck is supple with full range of passive and active motion. There are no carotid bruits on  auscultation. Oropharynx exam reveals: mild mouth dryness, mild pharyngeal erythema, adequate dental hygiene, moderate airway crowding, secondary to smaller airway entry, redundant soft palate and tonsils in place, about 1+ bilaterally. Mallampati is class II. Tongue protrudes centrally and palate elevates symmetrically.   Chest: Clear to auscultation without wheezing, rhonchi or crackles noted.  Heart: S1+S2+0, regular and normal without murmurs, rubs or gallops noted.   Abdomen: Soft, non-tender and non-distended with normal bowel sounds appreciated on auscultation.  Extremities: There is no pitting edema in the distal lower extremities bilaterally. Pedal pulses are intact.  Skin: Warm and dry  without trophic changes noted.   Musculoskeletal: exam reveals no obvious joint deformities, tenderness or joint swelling or erythema, with the exception that she does report some right-sided neck pain and shoulder pain.   Neurologically:  Mental status: The patient is awake, alert and oriented in all 4 spheres. Her immediate and remote memory, attention, language skills and fund of knowledge are appropriate. There is no evidence of aphasia, agnosia, apraxia or anomia. Speech is clear with normal prosody and enunciation. Thought process is linear. Mood is normal and affect is normal.  Cranial nerves II - XII are as described above under HEENT exam. In addition: shoulder shrug is normal with equal shoulder height noted. Motor exam: Normal bulk, strength and tone is noted. There is no drift, tremor or rebound. Romberg is negative. Reflexes are 2+ throughout. Fine motor skills and coordination: intact with normal finger taps, normal hand movements, normal rapid alternating patting, normal foot taps and normal foot agility.  Cerebellar testing: No dysmetria or intention tremor on finger to nose testing. Heel to shin is unremarkable bilaterally. There is no truncal or gait ataxia.  Sensory exam: intact to light touch, pinprick, vibration, temperature sense in the upper and lower extremities.  Gait, station and balance: She stands easily. No veering to one side is noted. No leaning to one side is noted. Posture is age-appropriate and stance is narrow based. Gait shows normal stride length and normal pace. No problems turning are noted. She turns en bloc. Tandem walk is unremarkable.   Assessment and plan:   In summary, TONNYA GARBETT is a very pleasant 55 year old female with an underlying medical history of asthma, chest pain, palpitations, and overweight state, who presents for follow-up consultation of her moderate to severe obstructive sleep apnea, after her sleep studies, now established on CPAP  therapy. We talked about her sleep study results from her baseline sleep study from 08/22/2015 as well as her CPAP titration study results from 09/16/2015 in detail today. Findings were explained to her and the differences pointed out. We also went over her compliance data in detail. She is commended for her treatment adherence and reports improvements, nothing drastic, but mild improvement in her sleep consolidation, nocturia, daytime tiredness are reported. Physical exam is stable with the exception of interim mild weight gain, of note, she was also treated with prednisone in the interim. I again explained in particular the risks and ramifications of untreated moderate to severe OSA, especially with respect to developing cardiovascular disease down the Road, including congestive heart failure, difficult to treat hypertension, cardiac arrhythmias, or stroke. Even type 2 diabetes has, in part, been linked to untreated OSA. Symptoms of untreated OSA include daytime sleepiness, memory problems, mood irritability and mood disorder such as depression and anxiety, lack of energy, as well as recurrent headaches, especially morning headaches. We talked about trying to maintain a healthy lifestyle in general, as  well as the importance of weight control. I advised the patient not to drive when feeling sleepy. She is advised to stay fully compliant with CPAP. At this juncture, we will see her back in 6 months, and she can see one of our nurse practitioners at the time. I will see her back after that. Hopefully once a year if she continues to do well.  I explained the importance of being compliant with PAP treatment, not only for insurance purposes but primarily to improve Her symptoms, and for the patient's long term health benefit, including to reduce Her cardiovascular risks.  I answered all her questions today and the patient was in agreement. I spent 25 minutes in total face-to-face time with the patient, more than  50% of which was spent in counseling and coordination of care, reviewing test results, reviewing medication and discussing or reviewing the diagnosis of OSA, its prognosis and treatment options.

## 2015-11-27 NOTE — Patient Instructions (Addendum)
Please continue using your CPAP regularly. While your insurance requires that you use CPAP at least 4 hours each night on 70% of the nights, I recommend, that you not skip any nights and use it throughout the night if you can. Getting used to CPAP and staying with the treatment long term does take time and patience and discipline. Untreated obstructive sleep apnea when it is moderate to severe can have an adverse impact on cardiovascular health and raise her risk for heart disease, arrhythmias, hypertension, congestive heart failure, stroke and diabetes. Untreated obstructive sleep apnea causes sleep disruption, nonrestorative sleep, and sleep deprivation. This can have an impact on your day to day functioning and cause daytime sleepiness and impairment of cognitive function, memory loss, mood disturbance, and problems focussing. Using CPAP regularly can improve these symptoms.   You can try Melatonin at night for sleep: take 1 mg to 3 mg, one to 2 hours before your bedtime. You can go up to 5 mg if needed. It is over the counter and comes in pill form, chewable form and spray, if you prefer.     Keep up the good work! We can see you back in 6 months for sleep apnea check up, and you can see one of our nurse practitioners and I will see you back yearly after that.

## 2016-01-31 ENCOUNTER — Encounter: Payer: Self-pay | Admitting: Internal Medicine

## 2016-01-31 ENCOUNTER — Ambulatory Visit (INDEPENDENT_AMBULATORY_CARE_PROVIDER_SITE_OTHER): Payer: Managed Care, Other (non HMO) | Admitting: Internal Medicine

## 2016-01-31 VITALS — BP 178/98 | HR 74 | Temp 98.0°F | Ht 67.0 in | Wt 190.2 lb

## 2016-01-31 DIAGNOSIS — N951 Menopausal and female climacteric states: Secondary | ICD-10-CM

## 2016-01-31 DIAGNOSIS — I10 Essential (primary) hypertension: Secondary | ICD-10-CM | POA: Diagnosis not present

## 2016-01-31 DIAGNOSIS — J45909 Unspecified asthma, uncomplicated: Secondary | ICD-10-CM | POA: Diagnosis not present

## 2016-01-31 DIAGNOSIS — G4733 Obstructive sleep apnea (adult) (pediatric): Secondary | ICD-10-CM | POA: Diagnosis not present

## 2016-01-31 DIAGNOSIS — Z9989 Dependence on other enabling machines and devices: Secondary | ICD-10-CM

## 2016-01-31 MED ORDER — TIOTROPIUM BROMIDE MONOHYDRATE 1.25 MCG/ACT IN AERS: 2.0000 | INHALATION_SPRAY | Freq: Once | RESPIRATORY_TRACT | 4 refills | Status: DC

## 2016-01-31 MED ORDER — ALBUTEROL SULFATE HFA 108 (90 BASE) MCG/ACT IN AERS: 2.0000 | INHALATION_SPRAY | Freq: Four times a day (QID) | RESPIRATORY_TRACT | 6 refills | Status: DC | PRN

## 2016-01-31 MED ORDER — ESTRADIOL 2 MG PO TABS: 2.0000 mg | ORAL_TABLET | Freq: Every day | ORAL | 6 refills | Status: DC

## 2016-01-31 MED ORDER — NEBIVOLOL HCL 5 MG PO TABS: 5.0000 mg | ORAL_TABLET | Freq: Every day | ORAL | 6 refills | Status: DC

## 2016-01-31 MED ORDER — PAROXETINE HCL 20 MG PO TABS: 20.0000 mg | ORAL_TABLET | Freq: Every day | ORAL | 6 refills | Status: DC

## 2016-01-31 NOTE — Progress Notes (Signed)
Patient ID: Karen Molina, female   DOB: December 08, 1960, 55 y.o.   MRN: [id number]    Location:  [facility] Place of Service: OFFICE  Chief Complaint  Patient presents with  . Follow-up  . Flu Vaccine    can not take flu vacc.    HPI:  55 yo female sen today for f/u. She continues to have right shoulder pain. Ortho recommended sx but she is hesitant to do so. She would like to have 2nd opinion but plans to wait until insurance changes. Needs med RF as she has been out x 1 month.  Her job is very stressful. She has been out of estradiol and paxil. No HA or dizziness, CP, SOB  Joint pain - she c/o knee pain since being out of anti-inflammatory. She has right shoulder pain and cannot raise arm above head due to pain. No known trauma. No relief with ibuprofen  Vasomotor sx's - hot flashes slightly improved on estradiol and paroxetine  Asthma - stable on spiriva. Last PFTs in 2016 revealed obstruction  Hx HTN - previously diet controlled. Repeat BP by myself 170/100. She has taken BP meds in the past  OSA - uses CPAP qHS. Followed by pulmonary Dr Rexene Alberts. She reports sleeping better and awakens feeling less groggy   Past Medical History:  Diagnosis Date  . Asthma   . Cervical cancer ([place])   . Chest pain   . Fatigue   . H/O: hysterectomy   . Insomnia   . Palpitations   . Paresthesias    lower extremities    Past Surgical History:  Procedure Laterality Date  . ABDOMINAL HYSTERECTOMY    . CERVICAL ABLATION    . INGUINAL HERNIA REPAIR    . UTERINE FIBROID SURGERY      Patient Care Team: Gildardo Cranker, DO as PCP - General (Internal Medicine)  Social History   Social History  . Marital status: Divorced    Spouse name: N/A  . Number of children: 2  . Years of education: College   Occupational History  . [employer]     Social History Main Topics  . Smoking status: Former Smoker    Packs/day: 0.50    Types: Cigarettes  . Smokeless tobacco: Never Used     Comment: Stopped  2000  . Alcohol use 2.4 oz/week    4 Cans of beer per week     Comment: wine on weekends  . Drug use: No     Comment: Quit 03/2003  . Sexual activity: Not on file   Other Topics Concern  . Not on file   Social History Narrative   Diet: Regular        Do you drink/ eat things with caffeine? No      Marital status:    Single                           What year were you married ?      Do you live in a house, apartment,assistred living, condo, trailer, etc.)? House      Is it one or more stories? 2      How many persons live in your home ? 2      Do you have any pets in your home ?(please list) No      Current or past profession: Quality Auditor       Do you exercise? Yes  Type & how often: Walk      Do you have a living will? No      Do you have a DNR form?  No                     If not, do you want to discuss one? Yes      Do you have signed POA?HPOA forms?  No               If so, please bring to your        appointment   Denies caffeine use         reports that she has quit smoking. Her smoking use included Cigarettes. She smoked 0.50 packs per day. She has never used smokeless tobacco. She reports that she drinks about 2.4 oz of alcohol per week . She reports that she does not use drugs.  Family History  Problem Relation Age of Onset  . Hypertension Mother   . Diabetes Mother   . Osteoarthritis Mother   . Stroke Maternal Grandmother   . Diabetes Maternal Grandmother   . Diabetes Brother   . Hypertension Brother   . HIV/AIDS Brother   . Drug abuse Brother   . Lupus Sister   . Arthritis Sister   . Breast cancer Daughter   . Asthma Father   . Cancer Paternal Grandfather    Family Status  Relation Status  . Mother Alive  . Maternal Grandmother Deceased  . Brother Alive  . Brother Deceased  . Sister Alive  . Daughter Alive  . Father Alive  . Brother Alive  . Brother Alive  . Son Alive  . Paternal Grandfather       Allergies  Allergen Reactions  . Influenza Virus Vacc Split Pf Other (See Comments)    "deadly sick "  . Penicillins Hives, Itching and Swelling    Medications: Patient's Medications  New Prescriptions   No medications on file  Previous Medications   ALBUTEROL (PROVENTIL HFA;VENTOLIN HFA) 108 (90 BASE) MCG/ACT INHALER    Inhale 2 puffs into the lungs every 6 (six) hours as needed for wheezing or shortness of breath.   ESTRADIOL (ESTRACE) 2 MG TABLET    Take 1 tablet (2 mg total) by mouth daily.   FENOPROFEN (NALFON) 600 MG TABS TABLET    TAKE 1 TABLET (600 MG TOTAL) BY MOUTH DAILY.   IBUPROFEN (ADVIL,MOTRIN) 800 MG TABLET    Take 1 tablet (800 mg total) by mouth every 8 (eight) hours as needed for moderate pain.   PAROXETINE (PAXIL) 20 MG TABLET    Take 1 tablet (20 mg total) by mouth daily.   TIOTROPIUM BROMIDE MONOHYDRATE (SPIRIVA RESPIMAT) 1.25 MCG/ACT AERS    Inhale 2 puffs into the lungs once. daily  Modified Medications   No medications on file  Discontinued Medications   No medications on file    Review of Systems  Musculoskeletal: Positive for arthralgias, joint swelling and myalgias.  All other systems reviewed and are negative.   Vitals:   01/31/16 1553  BP: (!) 178/98  Pulse: 74  Temp: 98 F (36.7 C)  TempSrc: Oral  SpO2: 98%  Weight: 190 lb 3.2 oz (86.3 kg)  Height: 5\' 7"  (1.702 m)   Body mass index is 29.79 kg/m.  Physical Exam  Constitutional: She is oriented to person, place, and time. She appears well-developed and well-nourished.  HENT:  Mouth/Throat: Oropharynx is clear and moist. No oropharyngeal  exudate.  Eyes: Pupils are equal, round, and reactive to light. No scleral icterus.  Neck: Neck supple. Carotid bruit is not present. No tracheal deviation present. No thyromegaly present.  Cardiovascular: Normal rate, regular rhythm, normal heart sounds and intact distal pulses.  Exam reveals no gallop and no friction rub.   No murmur heard. No  LE edema b/l. no calf TTP.   Pulmonary/Chest: Effort normal and breath sounds normal. No stridor. No respiratory distress. She has no wheezes. She has no rales.  Abdominal: Soft. Bowel sounds are normal. She exhibits no distension and no mass. There is no hepatomegaly. There is no tenderness. There is no rebound and no guarding.  Musculoskeletal: She exhibits edema and tenderness.       Right shoulder: She exhibits decreased range of motion, tenderness, swelling, pain and spasm. She exhibits no bony tenderness, no effusion and no crepitus.       Arms: Lymphadenopathy:    She has no cervical adenopathy.  Neurological: She is alert and oriented to person, place, and time.  Skin: Skin is warm and dry. No rash noted.  Psychiatric: Her behavior is normal. Her affect is blunt.     Labs reviewed: No visits with results within 3 Month(s) from this visit.  Latest known visit with results is:  Office Visit on 11/09/2014  Component Date Value Ref Range Status  . FVC-Pre 11/09/2014 2.83  L Final  . FVC-%Pred-Pre 11/09/2014 93  % Final  . FVC-Post 11/09/2014 2.97  L Final  . FVC-%Pred-Post 11/09/2014 98  % Final  . FVC-%Change-Post 11/09/2014 4  % Final  . FEV1-Pre 11/09/2014 1.95  L Final  . FEV1-%Pred-Pre 11/09/2014 81  % Final  . FEV1-Post 11/09/2014 2.18  L Final  . FEV1-%Pred-Post 11/09/2014 90  % Final  . FEV1-%Change-Post 11/09/2014 11  % Final  . FEV6-Pre 11/09/2014 2.83  L Final  . FEV6-%Pred-Pre 11/09/2014 96  % Final  . FEV6-Post 11/09/2014 2.97  L Final  . FEV6-%Pred-Post 11/09/2014 101  % Final  . FEV6-%Change-Post 11/09/2014 4  % Final  . Pre FEV1/FVC ratio 11/09/2014 69  % Final  . FEV1FVC-%Pred-Pre 11/09/2014 86  % Final  . Post FEV1/FVC ratio 11/09/2014 73  % Final  . FEV1FVC-%Change-Post 11/09/2014 6  % Final  . Pre FEV6/FVC Ratio 11/09/2014 100  % Final  . FEV6FVC-%Pred-Pre 11/09/2014 103  % Final  . Post FEV6/FVC ratio 11/09/2014 100  % Final  . FEV6FVC-%Pred-Post  11/09/2014 103  % Final  . FEF 25-75 Pre 11/09/2014 1.38  L/sec Final  . FEF2575-%Pred-Pre 11/09/2014 57  % Final  . FEF 25-75 Post 11/09/2014 1.92  L/sec Final  . FEF2575-%Pred-Post 11/09/2014 79  % Final  . FEF2575-%Change-Post 11/09/2014 39  % Final  . DLCO unc 11/09/2014 17.75  ml/min/mmHg Final  . DLCO unc % pred 11/09/2014 65  % Final  . DL/VA 11/09/2014 3.99  ml/min/mmHg/L Final  . DL/VA % pred 11/09/2014 79  % Final    No results found.   Assessment/Plan   ICD-9-CM ICD-10-CM   1. Essential hypertension - suboptimally controlled due to being out of medication x several days 401.9 I10 nebivolol (BYSTOLIC) 5 MG tablet  2. Hot flashes, menopausal 627.2 N95.1 PARoxetine (PAXIL) 20 MG tablet     estradiol (ESTRACE) 2 MG tablet  3. Extrinsic asthma, unspecified asthma severity, uncomplicated [id number]   4. OSA on CPAP 327.23 G47.33    V46.8 [id number]     Take bystolic 1 tab daily  Check blood pressure at home daily and bring readings to next visit  Continue other medications as ordered  Will need fasting labs at appt  Follow up with specialists as scheduled  Follow up in Jan/Feb 2018 for [place]. Perlie Gold  [facility] [address] [place],  [address] [phone] Cell (Monday-Friday 8 AM - 5 PM) [phone] After 5 PM and follow prompts

## 2016-01-31 NOTE — Patient Instructions (Signed)
Take bystolic 1 tab daily   Check blood pressure at home daily and bring readings to next visit  Continue other medications as ordered  Will need fasting labs at appt  Follow up with specialists as scheduled  Follow up in Jan/Feb 2018 for CPE/pap/ECG

## 2016-02-02 DIAGNOSIS — G4733 Obstructive sleep apnea (adult) (pediatric): Secondary | ICD-10-CM | POA: Insufficient documentation

## 2016-02-02 DIAGNOSIS — Z9989 Dependence on other enabling machines and devices: Secondary | ICD-10-CM

## 2016-02-02 DIAGNOSIS — N951 Menopausal and female climacteric states: Secondary | ICD-10-CM | POA: Insufficient documentation

## 2016-04-20 ENCOUNTER — Other Ambulatory Visit: Payer: Self-pay

## 2016-04-20 ENCOUNTER — Other Ambulatory Visit: Payer: BLUE CROSS/BLUE SHIELD

## 2016-04-20 DIAGNOSIS — I10 Essential (primary) hypertension: Secondary | ICD-10-CM

## 2016-04-20 DIAGNOSIS — Z0000 Encounter for general adult medical examination without abnormal findings: Secondary | ICD-10-CM

## 2016-04-20 LAB — COMPLETE METABOLIC PANEL WITH GFR
ALT: 14 U/L (ref 6–29)
AST: 15 U/L (ref 10–35)
Albumin: 3.9 g/dL (ref 3.6–5.1)
Alkaline Phosphatase: 42 U/L (ref 33–130)
BILIRUBIN TOTAL: 0.3 mg/dL (ref 0.2–1.2)
BUN: 14 mg/dL (ref 7–25)
CALCIUM: 9.1 mg/dL (ref 8.6–10.4)
CO2: 28 mmol/L (ref 20–31)
CREATININE: 0.61 mg/dL (ref 0.50–1.05)
Chloride: 106 mmol/L (ref 98–110)
GFR, Est African American: >89 mL/min (ref >=60)
GFR, Est Non African American: >89 mL/min (ref >=60)
Glucose, Bld: 114 mg/dL — ABNORMAL HIGH (ref 65–99)
Potassium: 4.4 mmol/L (ref 3.5–5.3)
Sodium: 138 mmol/L (ref 135–146)
TOTAL PROTEIN: 6.9 g/dL (ref 6.1–8.1)

## 2016-04-20 LAB — CBC WITH DIFFERENTIAL/PLATELET
BASOS PCT: 0 %
Basophils Absolute: 0 cells/uL (ref 0–200)
EOS PCT: 1 %
Eosinophils Absolute: 50 cells/uL (ref 15–500)
HEMATOCRIT: 34.6 % — AB (ref 35.0–45.0)
HEMOGLOBIN: 10.9 g/dL — AB (ref 11.7–15.5)
LYMPHS ABS: 1750 {cells}/uL (ref 850–3900)
Lymphocytes Relative: 35 %
MCH: 23.3 pg — ABNORMAL LOW (ref 27.0–33.0)
MCHC: 31.5 g/dL — AB (ref 32.0–36.0)
MCV: 74.1 fL — ABNORMAL LOW (ref 80.0–100.0)
MONO ABS: 400 {cells}/uL (ref 200–950)
MPV: 9.8 fL (ref 7.5–12.5)
Monocytes Relative: 8 %
NEUTROS ABS: 2800 {cells}/uL (ref 1500–7800)
NEUTROS PCT: 56 %
Platelets: 308 10*3/uL (ref 140–400)
RBC: 4.67 MIL/uL (ref 3.80–5.10)
RDW: 17.3 % — ABNORMAL HIGH (ref 11.0–15.0)
WBC: 5 10*3/uL (ref 3.8–10.8)

## 2016-04-20 LAB — LIPID PANEL
CHOL/HDL RATIO: 3.3 ratio (ref <5.0)
CHOLESTEROL: 154 mg/dL (ref <200)
HDL: 47 mg/dL — AB (ref >50)
LDL CALC: 68 mg/dL (ref <100)
TRIGLYCERIDES: 196 mg/dL — AB (ref <150)
VLDL: 39 mg/dL — ABNORMAL HIGH (ref <30)

## 2016-04-20 LAB — URINALYSIS, ROUTINE W REFLEX MICROSCOPIC
Bilirubin Urine: NEGATIVE
Glucose, UA: NEGATIVE
Hgb urine dipstick: NEGATIVE
KETONES UR: NEGATIVE
LEUKOCYTES UA: NEGATIVE
NITRITE: NEGATIVE
PH: 6 (ref 5.0–8.0)
Protein, ur: NEGATIVE
SPECIFIC GRAVITY, URINE: 1.021 (ref 1.001–1.035)

## 2016-04-20 LAB — TSH: TSH: 0.66 mIU/L

## 2016-04-24 ENCOUNTER — Encounter: Payer: Self-pay | Admitting: Internal Medicine

## 2016-04-24 ENCOUNTER — Ambulatory Visit (INDEPENDENT_AMBULATORY_CARE_PROVIDER_SITE_OTHER): Payer: BLUE CROSS/BLUE SHIELD | Admitting: Internal Medicine

## 2016-04-24 VITALS — BP 174/96 | HR 75 | Temp 98.1°F | Ht 67.0 in | Wt 194.8 lb

## 2016-04-24 DIAGNOSIS — N951 Menopausal and female climacteric states: Secondary | ICD-10-CM

## 2016-04-24 DIAGNOSIS — G8929 Other chronic pain: Secondary | ICD-10-CM | POA: Diagnosis not present

## 2016-04-24 DIAGNOSIS — I10 Essential (primary) hypertension: Secondary | ICD-10-CM | POA: Diagnosis not present

## 2016-04-24 DIAGNOSIS — Z0000 Encounter for general adult medical examination without abnormal findings: Secondary | ICD-10-CM

## 2016-04-24 DIAGNOSIS — M25511 Pain in right shoulder: Secondary | ICD-10-CM

## 2016-04-24 DIAGNOSIS — N952 Postmenopausal atrophic vaginitis: Secondary | ICD-10-CM

## 2016-04-24 DIAGNOSIS — N760 Acute vaginitis: Secondary | ICD-10-CM

## 2016-04-24 DIAGNOSIS — R739 Hyperglycemia, unspecified: Secondary | ICD-10-CM | POA: Diagnosis not present

## 2016-04-24 DIAGNOSIS — Z124 Encounter for screening for malignant neoplasm of cervix: Secondary | ICD-10-CM | POA: Diagnosis not present

## 2016-04-24 DIAGNOSIS — J4540 Moderate persistent asthma, uncomplicated: Secondary | ICD-10-CM

## 2016-04-24 DIAGNOSIS — H833X3 Noise effects on inner ear, bilateral: Secondary | ICD-10-CM

## 2016-04-24 MED ORDER — NEBIVOLOL HCL 5 MG PO TABS: 5.0000 mg | ORAL_TABLET | Freq: Every day | ORAL | 6 refills | Status: DC

## 2016-04-24 MED ORDER — METRONIDAZOLE 500 MG PO TABS: 500.0000 mg | ORAL_TABLET | Freq: Two times a day (BID) | ORAL | 0 refills | Status: DC

## 2016-04-24 MED ORDER — FLUTICASONE FUROATE-VILANTEROL 100-25 MCG/INH IN AEPB: 1.0000 | INHALATION_SPRAY | Freq: Every day | RESPIRATORY_TRACT | 6 refills | Status: DC

## 2016-04-24 MED ORDER — FLUCONAZOLE 150 MG PO TABS: ORAL_TABLET | ORAL | 0 refills | Status: DC

## 2016-04-24 NOTE — Patient Instructions (Addendum)
Encouraged her to exercise 30-45 minutes 4-5 times per week. Eat a well balanced diet. Avoid smoking. Limit alcohol intake. Wear seatbelt when riding in the car. Wear sun block (SPF >50) when spending extended times outside.  Take all of flagyl and diflucan as directed  Will call with GYN referral  Recommend hearing eval for hearing loss  Rinse mouth out after each use of Breo - 1 puff daily  Continue other medications as ordered  Follow up in 6 weeks for asthma.     Hyperglycemia Hyperglycemia is when the sugar (glucose) level in your blood is too high. It may not cause symptoms. If you do have symptoms, they may include warning signs, such as:  Feeling more thirsty than normal.  Hunger.  Feeling tired.  Needing to pee (urinate) more than normal.  Blurry eyesight (vision). You may get other symptoms as it gets worse, such as:  Dry mouth.  Not being hungry (loss of appetite).  Fruity-smelling breath.  Weakness.  Weight gain or loss that is not planned. Weight loss may be fast.  A tingling or numb feeling in your hands or feet.  Headache.  Skin that does not bounce back quickly when it is lightly pinched and released (poor skin turgor).  Pain in your belly (abdomen).  Cuts or bruises that heal slowly. High blood sugar can happen to people who do or do not have diabetes. High blood sugar can happen slowly or quickly, and it can be an emergency. Follow these instructions at home: General instructions  Take over-the-counter and prescription medicines only as told by your doctor.  Do not use products that contain nicotine or tobacco, such as cigarettes and e-cigarettes. If you need help quitting, ask your doctor.  Limit alcohol intake to no more than 1 drink per day for nonpregnant women and 2 drinks per day for men. One drink equals 12 oz of beer, 5 oz of wine, or 1 oz of hard liquor.  Manage stress. If you need help with this, ask your doctor.  Keep all  follow-up visits as told by your doctor. This is important. Eating and drinking  Stay at a healthy weight.  Exercise regularly, as told by your doctor.  Drink enough fluid, especially when you:  Exercise.  Get sick.  Are in hot temperatures.  Eat healthy foods, such as:  Low-fat (lean) proteins.  Complex carbs (complex carbohydrates), such as whole wheat bread or Nesler rice.  Fresh fruits and vegetables.  Low-fat dairy products.  Healthy fats.  Drink enough fluid to keep your pee (urine) clear or pale yellow. If you have diabetes:  Make sure you know the symptoms of hyperglycemia.  Follow your diabetes management plan, as told by your doctor. Make sure you:  Take insulin and medicines as told.  Follow your exercise plan.  Follow your meal plan. Eat on time. Do not skip meals.  Check your blood sugar as often as told. Make sure to check before and after exercise. If you exercise longer or in a different way than you normally do, check your blood sugar more often.  Follow your sick day plan whenever you cannot eat or drink normally. Make this plan ahead of time with your doctor.  Share your diabetes management plan with people in your workplace, school, and household.  Check your urine for ketones when you are ill and as told by your doctor.  Carry a card or wear jewelry that says that you have diabetes. Contact a doctor  if:  Your blood sugar level is higher than 240 mg/dL (13.3 mmol/L) for 2 days in a row.  You have problems keeping your blood sugar in your target range.  High blood sugar happens often for you. Get help right away if:  You have trouble breathing.  You have a change in how you think, feel, or act (mental status).  You feel sick to your stomach (nauseous), and that feeling does not go away.  You cannot stop throwing up (vomiting). These symptoms may be an emergency. Do not wait to see if the symptoms will go away. Get medical help right  away. Call your local emergency services (911 in the [place] Do not drive yourself to the hospital.  Summary  Hyperglycemia is when the sugar (glucose) level in your blood is too high.  High blood sugar can happen to people who do or do not have diabetes.  Make sure you drink enough fluids, eat healthy foods, and exercise regularly.  Contact your doctor if you have problems keeping your blood sugar in your target range. This information is not intended to replace advice given to you by your health care provider. Make sure you discuss any questions you have with your health care provider. Document Released: 01/04/2009 Document Revised: 11/25/2015 Document Reviewed: 11/25/2015 Elsevier Interactive Patient Education  2017 [employer].

## 2016-04-24 NOTE — Progress Notes (Signed)
Patient ID: Karen Molina, female   DOB: 1960/11/29, 56 y.o.   MRN: [id number]   Location:  [facility]  Place of Service:  OFFICE  Provider: Arletha Grippe, DO  Patient Care Team: Gildardo Cranker, DO as PCP - General (Internal Medicine)  Extended Emergency Contact Information Primary Emergency Contact: [facility] Address: [address]          [place] [address] [place] Phone: [phone] Relation: Mother  Code Status: FULL CODE Goals of Care: Advanced Directive information Advanced Directives 04/24/2016  Does Patient Have a Medical Advance Directive? No  Would patient like information on creating a medical advance directive? No - Patient declined     Chief Complaint  Patient presents with  . Annual Exam    Yearly Exam  . Medication Management    patient has not taking bystolic for a week she misplaced prescription    HPI: Patient is a 56 y.o. female seen in today for an annual wellness exam.     Her job is very stressful. No HA or dizziness, CP, SOB  Joint pain - she c/o knee pain since being out of anti-inflammatory. She has right shoulder pain and cannot raise arm above head due to pain. No known trauma. No relief with ibuprofen. She is thinking about a 2nd opinion but no tready yet  Vasomotor sx's - hot flashes slightly improved on estradiol and paroxetine but still uncontrolled. She has vaginal dryness that interferes with sexual intercourse. She uses large amts of lubricants.  Asthma - she uses HFA 5-6 times within 8 hr work day. She takes spiriva qhs.  Last PFTs in 2016 revealed obstruction  HTN - she ran out of bystolic a few days ago. BP at home was improved on med but she does not remember exact numbers. previously diet controlled. Repeat BP by myself 170/100. She has taken BP meds in the past  OSA - uses CPAP qHS. Followed by pulmonary Dr Rexene Alberts. She reports sleeping better and awakens feeling less groggy  Depression screen [facility] 2/9 01/31/2016    Decreased Interest 0  Down, Depressed, Hopeless 0  PHQ - 2 Score 0    Fall Risk  04/24/2016 01/31/2016 05/10/2015 10/05/2014  Falls in the past year? No No No Yes  Number falls in past yr: - - - 2 or more  Injury with Fall? - - - No   No flowsheet data found.   Health Maintenance  Topic Date Due  . Hepatitis C Screening  1960/03/31  . HIV Screening  12/26/1974  . TETANUS/TDAP  12/26/1978  . COLONOSCOPY  12/25/2009  . INFLUENZA VACCINE  10/22/2015  . MAMMOGRAM  11/08/2016  . PAP SMEAR  05/08/2018      Past Medical History:  Diagnosis Date  . Asthma   . Cervical cancer ([place])   . Chest pain   . Fatigue   . H/O: hysterectomy   . Insomnia   . Palpitations   . Paresthesias    lower extremities    Past Surgical History:  Procedure Laterality Date  . ABDOMINAL HYSTERECTOMY    . CERVICAL ABLATION    . INGUINAL HERNIA REPAIR    . UTERINE FIBROID SURGERY      Family History  Problem Relation Age of Onset  . Hypertension Mother   . Diabetes Mother   . Osteoarthritis Mother   . Stroke Maternal Grandmother   . Diabetes Maternal Grandmother   . Diabetes Brother   . Hypertension Brother   .  HIV/AIDS Brother   . Drug abuse Brother   . Lupus Sister   . Arthritis Sister   . Breast cancer Daughter   . Asthma Father   . Cancer Paternal Grandfather    Family Status  Relation Status  . Mother Alive  . Maternal Grandmother Deceased  . Brother Alive  . Brother Deceased  . Sister Alive  . Daughter Alive  . Father Alive  . Brother Alive  . Brother Alive  . Son Alive  . Paternal Grandfather     Social History   Social History  . Marital status: Divorced    Spouse name: N/A  . Number of children: 2  . Years of education: College   Occupational History  . [employer]     Social History Main Topics  . Smoking status: Former Smoker    Packs/day: 0.50    Types: Cigarettes  . Smokeless tobacco: Never Used     Comment: Stopped 2000  . Alcohol use 2.4 oz/week     4 Cans of beer per week     Comment: wine on weekends  . Drug use: No     Comment: Quit 03/2003  . Sexual activity: Not on file   Other Topics Concern  . Not on file   Social History Narrative   Diet: Regular        Do you drink/ eat things with caffeine? No      Marital status:    Single                           What year were you married ?      Do you live in a house, apartment,assistred living, condo, trailer, etc.)? House      Is it one or more stories? 2      How many persons live in your home ? 2      Do you have any pets in your home ?(please list) No      Current or past profession: Quality Auditor       Do you exercise? Yes                             Type & how often: Walk      Do you have a living will? No      Do you have a DNR form?  No                     If not, do you want to discuss one? Yes      Do you have signed POA?HPOA forms?  No               If so, please bring to your        appointment   Denies caffeine use        Allergies  Allergen Reactions  . Influenza Virus Vacc Split Pf Other (See Comments)    "deadly sick "  . Penicillins Hives, Itching and Swelling    Allergies as of 04/24/2016      Reactions   Influenza Virus Vacc Split Pf Other (See Comments)   "deadly sick "   Penicillins Hives, Itching, Swelling      Medication List       Accurate as of 04/24/16  3:16 PM. Always use your most recent med list.  albuterol 108 (90 Base) MCG/ACT inhaler Commonly known as:  PROVENTIL HFA;VENTOLIN HFA Inhale 2 puffs into the lungs every 6 (six) hours as needed for wheezing or shortness of breath.   estradiol 2 MG tablet Commonly known as:  ESTRACE Take 1 tablet (2 mg total) by mouth daily.   fenoprofen 600 MG Tabs tablet Commonly known as:  NALFON TAKE 1 TABLET (600 MG TOTAL) BY MOUTH DAILY.   nebivolol 5 MG tablet Commonly known as:  BYSTOLIC Take 1 tablet (5 mg total) by mouth daily.   PARoxetine 20 MG  tablet Commonly known as:  PAXIL Take 1 tablet (20 mg total) by mouth daily.   Tiotropium Bromide Monohydrate 1.25 MCG/ACT Aers Commonly known as:  SPIRIVA RESPIMAT Inhale 2 puffs into the lungs once. daily        Review of Systems:  Review of Systems  Constitutional: Positive for fatigue.  Respiratory: Positive for cough and wheezing.   Genitourinary: Positive for dyspareunia and vaginal pain.  Musculoskeletal: Positive for arthralgias.  All other systems reviewed and are negative.   Physical Exam: Vitals:   04/24/16 1501  BP: (!) 174/96  Pulse: 75  Temp: 98.1 F (36.7 C)  TempSrc: Oral  SpO2: 98%  Weight: 194 lb 12.8 oz (88.4 kg)  Height: 5\' 7"  (1.702 m)   Body mass index is 30.51 kg/m. Physical Exam  Constitutional: She is oriented to person, place, and time. She appears well-developed and well-nourished. No distress.  HENT:  Head: Normocephalic and atraumatic.  Right Ear: Hearing, tympanic membrane, external ear and ear canal normal.  Left Ear: Hearing, tympanic membrane, external ear and ear canal normal.  Mouth/Throat: Uvula is midline, oropharynx is clear and moist and mucous membranes are normal. She does not have dentures.  Increased cerumen b/l but no impaction  Eyes: Conjunctivae, EOM and lids are normal. Pupils are equal, round, and reactive to light. No scleral icterus.  Neck: Trachea normal and normal range of motion. Neck supple. Carotid bruit is not present. No tracheal deviation present. No thyroid mass and no thyromegaly present.  Cardiovascular: Normal rate, regular rhythm, normal heart sounds and intact distal pulses.  Exam reveals no gallop and no friction rub.   No murmur heard. No carotid bruit b/l. No LE edema b/l. No calf TTP.   Pulmonary/Chest: Effort normal. She has wheezes (with prolonged expiratory phase). She has no rhonchi. She has no rales. Right breast exhibits no inverted nipple, no mass, no nipple discharge, no skin change and no  tenderness. Left breast exhibits no inverted nipple, no mass, no nipple discharge, no skin change and no tenderness. Breasts are symmetrical.  Abdominal: Soft. Normal appearance, normal aorta and bowel sounds are normal. She exhibits no distension, no pulsatile midline mass and no mass. There is no hepatosplenomegaly. There is no tenderness. There is no rigidity, no rebound and no guarding. No hernia.  Genitourinary: Rectum normal. Rectal exam shows guaiac negative stool (white thick vaginal d/c). No breast swelling, tenderness, discharge or bleeding. There is no lesion on the right labia. There is no lesion on the left labia. Vaginal discharge found.  Genitourinary Comments: Absent uterus and no palpable adnexa; no bladder prolapse  Musculoskeletal: Normal range of motion. She exhibits edema and tenderness.  Lymphadenopathy:       Head (right side): No posterior auricular adenopathy present.       Head (left side): No posterior auricular adenopathy present.    She has no cervical adenopathy.  Right: No supraclavicular adenopathy present.       Left: No supraclavicular adenopathy present.  Neurological: She is alert and oriented to person, place, and time. She has normal strength and normal reflexes. No cranial nerve deficit. Gait normal.  Skin: Skin is warm, dry and intact. No rash noted. Nails show no clubbing.  Psychiatric: She has a normal mood and affect. Her speech is normal and behavior is normal. Thought content normal. Cognition and memory are normal.    Labs reviewed:  Basic Metabolic Panel:  Recent Labs  04/20/16 0812  NA 138  K 4.4  CL 106  CO2 28  GLUCOSE 114*  BUN 14  CREATININE 0.61  CALCIUM 9.1  TSH 0.66   Liver Function Tests:  Recent Labs  04/20/16 0812  AST 15  ALT 14  ALKPHOS 42  BILITOT 0.3  PROT 6.9  ALBUMIN 3.9   No results for input(s): LIPASE, AMYLASE in the last 8760 hours. No results for input(s): AMMONIA in the last 8760  hours. CBC:  Recent Labs  04/20/16 0812  WBC 5.0  NEUTROABS 2,800  HGB 10.9*  HCT 34.6*  MCV 74.1*  PLT 308   Lipid Panel:  Recent Labs  04/20/16 0812  CHOL 154  HDL 47*  LDLCALC 68  TRIG 196*  CHOLHDL 3.3   No results found for: HGBA1C  Procedures: No results found. ECG OBTAINED AND REVIEWED BY MYSELF:  NSR @ 66 bpm, nml axis, LAE, poor R wave progression. No acute ischemic changes. No change since 06/2014  Assessment/Plan   ICD-9-CM ICD-10-CM   1. Well adult exam V70.0 Z00.00   2. Essential hypertension 401.9 I10 nebivolol (BYSTOLIC) 5 MG tablet     EKG 12-Lead  3. Cervical cancer screening V76.2 Z12.4 PAP, Image Guided [[facility], [facility]]  4. Acute vaginitis 616.10 N76.0    BV + yeast  5. Moderate persistent asthma without complication [id number] fluticasone furoate-vilanterol (BREO ELLIPTA) 100-25 MCG/INH AEPB  6. Atrophic vaginitis 627.3 N95.2 Ambulatory referral to Gynecology  7. Noise-induced hearing loss of both ears 388.12 H83.3X3   8. Hot flashes, menopausal 627.2 N95.1 Ambulatory referral to Gynecology  9. Chronic right shoulder pain 719.41 M25.511    338.29 G89.29   10. Hyperglycemia 790.29 R73.9     Pt is UTD on health maintenance. Vaccinations are UTD. Pt maintains a healthy lifestyle. Encouraged pt to exercise 30-45 minutes 4-5 times per week. Eat a well balanced diet. Avoid smoking. Limit alcohol intake. Wear seatbelt when riding in the car. Wear sun block (SPF >50) when spending extended times outside.  Take all of flagyl and diflucan as directed  Will call with GYN referral  Recommend hearing eval for hearing loss  STOP spiriva as it is ineffective  Rinse mouth out after each use of Breo - 1 puff daily; samples provided  Continue other medications as ordered  Follow up in 6 weeks for asthma. T/c pulm eval if no better  Aalani Aikens S. Perlie Gold  [facility] [address] [place],  [place] [address] [phone] Cell (Monday-Friday 8 AM - 5 PM) [phone] After 5 PM and follow prompts

## 2016-04-26 DIAGNOSIS — N952 Postmenopausal atrophic vaginitis: Secondary | ICD-10-CM | POA: Insufficient documentation

## 2016-04-26 DIAGNOSIS — G8929 Other chronic pain: Secondary | ICD-10-CM | POA: Insufficient documentation

## 2016-04-26 DIAGNOSIS — M25511 Pain in right shoulder: Secondary | ICD-10-CM

## 2016-04-26 DIAGNOSIS — J4540 Moderate persistent asthma, uncomplicated: Secondary | ICD-10-CM | POA: Insufficient documentation

## 2016-04-26 DIAGNOSIS — H833X3 Noise effects on inner ear, bilateral: Secondary | ICD-10-CM | POA: Insufficient documentation

## 2016-04-26 DIAGNOSIS — R739 Hyperglycemia, unspecified: Secondary | ICD-10-CM | POA: Insufficient documentation

## 2016-04-27 LAB — PAP IG (IMAGE GUIDED)

## 2016-05-12 IMAGING — CR DG KNEE COMPLETE 4+V*R*
3 series · 3 of 3 positions shown · non-contrast
Comparison: None.

CLINICAL DATA: Patient with bilateral knee pain multiple weeks. No
known injury. Swelling in the right anterior knee. Initial
encounter.

EXAM:
RIGHT KNEE - COMPLETE 4+ VIEW

[w knee obl. right (1 of 2)]
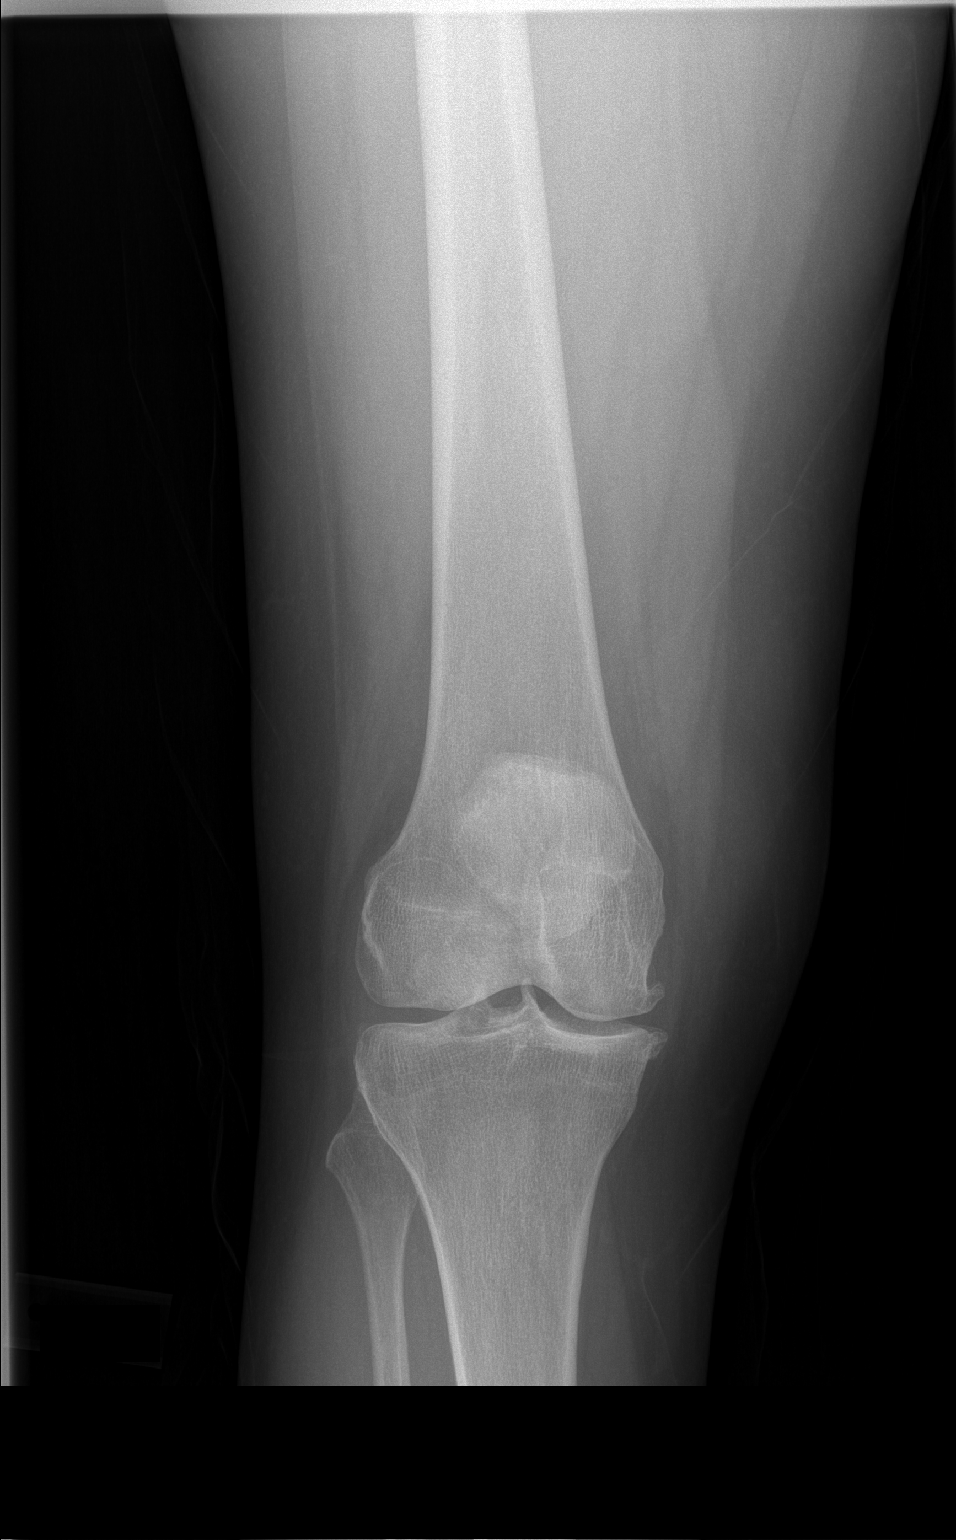

[w knee obl. right (2 of 2)]
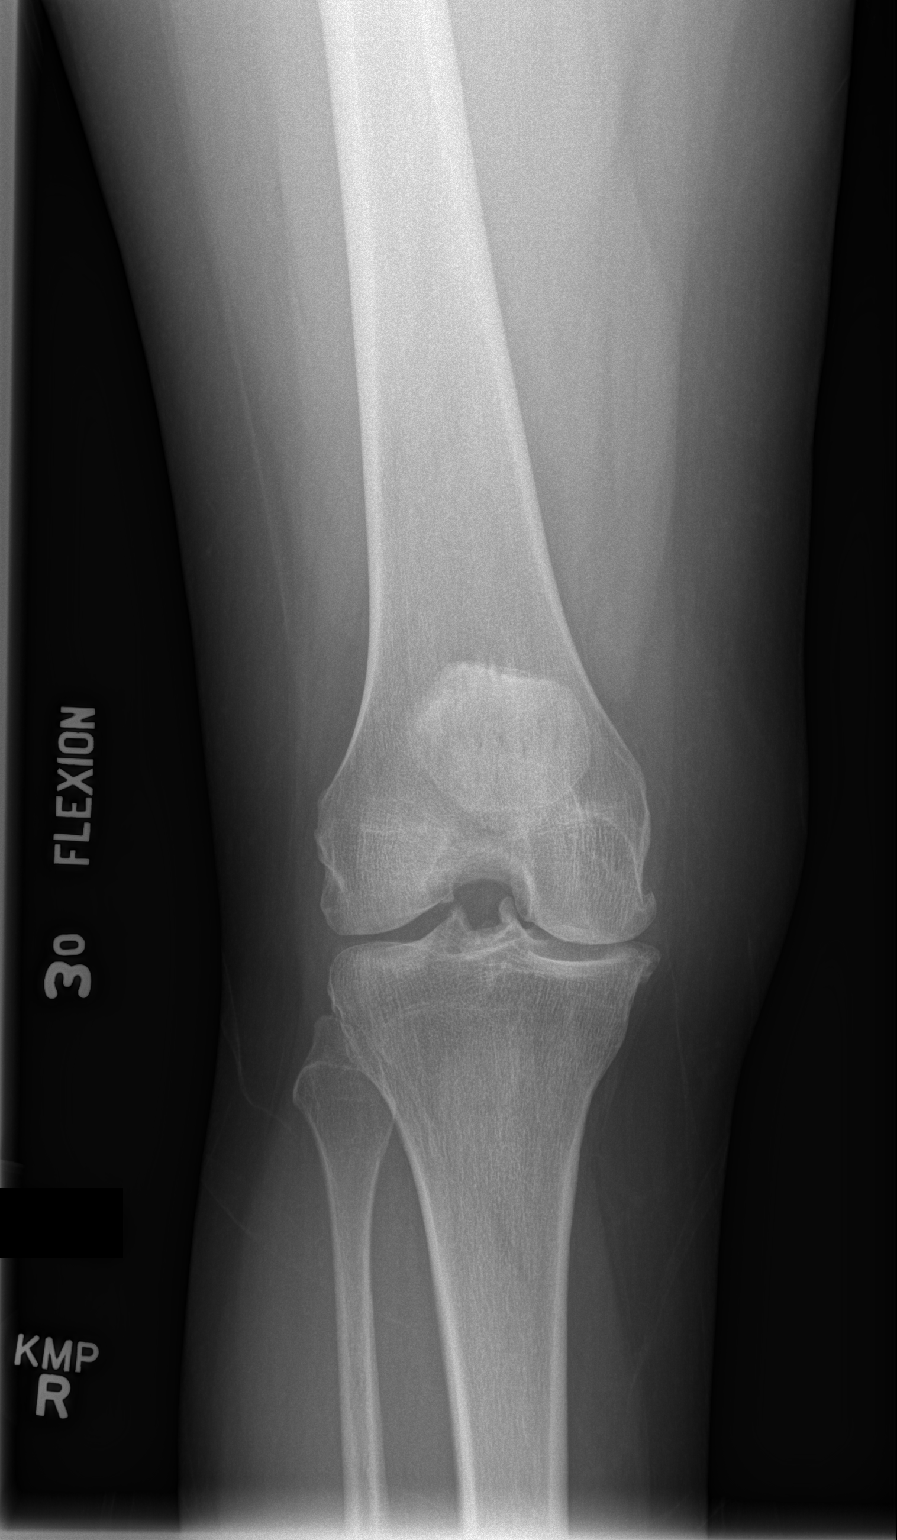

[w knee lat. right]
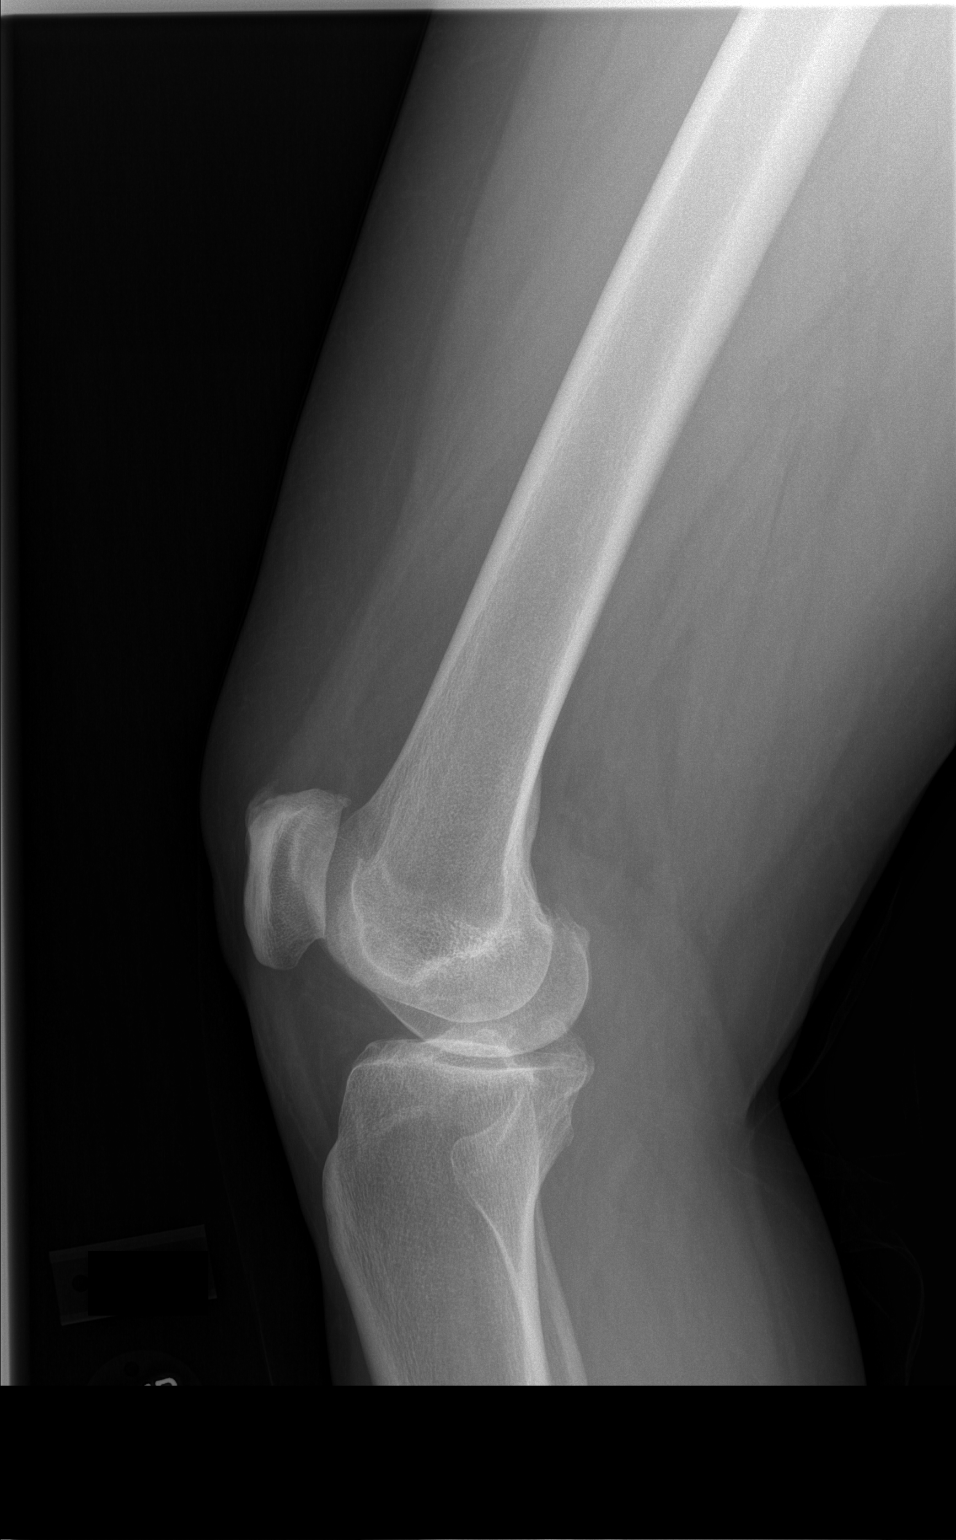

[3 of 3 positions shown; findings below may reference images not displayed]

FINDINGS: Normal anatomic alignment. Medial and patellofemoral compartment
osteophytosis. Medial compartment joint space narrowing. Spurring of
the tibial spines. No definite joint effusion. No evidence for acute
fracture or dislocation.
IMPRESSION: Medial and patellofemoral compartment degenerative changes.

## 2016-05-25 ENCOUNTER — Telehealth: Payer: Self-pay | Admitting: Adult Health

## 2016-05-25 ENCOUNTER — Encounter: Payer: Self-pay | Admitting: Adult Health

## 2016-05-25 ENCOUNTER — Ambulatory Visit (INDEPENDENT_AMBULATORY_CARE_PROVIDER_SITE_OTHER): Payer: BLUE CROSS/BLUE SHIELD | Admitting: Adult Health

## 2016-05-25 VITALS — BP 161/86 | HR 56 | Resp 18 | Ht 67.0 in | Wt 190.0 lb

## 2016-05-25 DIAGNOSIS — Z9989 Dependence on other enabling machines and devices: Secondary | ICD-10-CM | POA: Diagnosis not present

## 2016-05-25 DIAGNOSIS — G4733 Obstructive sleep apnea (adult) (pediatric): Secondary | ICD-10-CM | POA: Diagnosis not present

## 2016-05-25 NOTE — Progress Notes (Addendum)
PATIENT: STORI ROYSE DOB: 09-27-1960  REASON FOR VISIT: follow up HISTORY FROM: patient  HISTORY OF PRESENT ILLNESS: Ms. Friberg is a 56 year old female with a history of obstructive sleep apnea on CPAP. She returns today for a compliance download. Her download indicates that she is using her machine 27 out of 30 days for compliance of 90%. She uses her machine greater than 4 hours 24 out of 30 days for compliance of 80%. On average she uses her machine 7 hours and 7 minutes. Her residual AHI is 2.2 on 16 cm [place] EPR 3. She does not have a significant leak. She feels that the CPAP is still working well for her. She states she does not want to sleep without it. She does state that she will wake up during the night atleast once and have a hard time going back to sleep. She states that she would like to be able to sleep through the night without waking up. She has tried low-dose melatonin but for only one or 2 nights. She returns today for an evaluation.   HISTORY 11/27/15: Ms. Buckholtz is a 56 year old right-handed woman with an underlying medical history of asthma, chest pain, palpitations, and overweight state, who presents for follow-up consultation of her obstructive sleep apnea, after her recent sleep studies. The patient is unaccompanied today. Her first met her on 06/17/2015 at the request of her primary care physician, at which time she reported snoring and excessive daytime somnolence, as well as witnessed breathing pauses while asleep per family. I invited her back for sleep study. She had a baseline sleep study, followed by a CPAP titration study. I went over her test results with her in detail today. Her baseline sleep study from 08/22/2015 showed a sleep efficiency of 78.3% with a latency to sleep of 27 minutes and wake after sleep onset of 59.5 minutes with moderate to severe sleep fragmentation noted. She had an elevated arousal index. She had an increased percentage of stage I and stage  II sleep, absence of slow-wave sleep and absence of REM sleep. She had no significant PLMS, EKG or EEG changes. She had snoring and the supine and lateral positions, overall AHI was 28.7 per hour, average oxygen saturation 91%, nadir was 77%. Based on her test results and her sleep-related complaints I invited her back for a full night CPAP titration study. She had this on 09/16/2015. Sleep efficiency was 88.8% with a latency to sleep of 7 minutes and wake after sleep onset of 37 minutes with mild to moderate sleep fragmentation noted. She had an increased percentage of stage II sleep, absence of slow-wave sleep and REM sleep was 15.4% with a mildly prolonged REM latency of 125 minutes. She had no significant PLMS, EKG or EEG changes. Average oxygen saturation was 97%, nadir was 83%. She was titrated on CPAP from 5 cm to 16 cm. AHI was 0 per hour at the final pressure during non-REM sleep. She had REM sleep on the pressure of 14 cm with the residual AHI of 7.2 per hour. Based on her test results I prescribed CPAP therapy at a pressure of 16 cm via full facemask.  Today, 11/27/2015: I reviewed her CPAP compliance data from 10/26/2015 through 11/24/2015 which is a total of 30 days, during which time she used her machine 28 days with percent used days greater than 4 hours at 93%, indicating excellent compliance with an average usage for all days of 7 hours and 4 minutes, residual AHI  1 per hour, leak at times high with the 95th percentile at 23.7 L/m on a pressure of 16 cm with EPR of 3.  Today, 11/27/2015: She reports doing well, compliant with treatment, feels a little better with her sleep, less sluggish. Nocturia is a little less. Dreams more. Unfortunately, she has had night sweats with hot flashes and her FFM tends to leak and dislodge. Per BF, she no longer snores. She was treated for left shoulder pain recently. She was given prednisone as well his pain medication. She finished the prednisone and is no  longer on narcotic pain medication.  Previously:  06/17/2015: She reports snoring and excessive daytime somnolence as well as witnessed breathing pauses while asleep, per family. She lives with her 46 yo son (she has a 37 yo daughter as well).  She works from 8 AM to sometimes 8 PM. She is currently restricted to 8 hours. She has been having some nasal congestion, some drainage lately, feels like she is coming down with the cold.  Typical bedtime is between 8:30 and 9 PM. She does not have trouble going to sleep but has trouble staying asleep and reports multiple nighttime awakenings and nocturia about 4-5 times each night. She denies morning headaches. She had restless leg symptoms in the past but not recently. She has no family history of RLS. Her Epworth sleepiness score is 12 out of 24 today, her fatigue score is 20 out of 63. She quit smoking in 2000, quit smoking marijuana in 2005, drinks alcohol occasionally, in the form of wine on weekends. She does not drink caffeine on a daily basis.  I reviewed your office note from 05/10/2015.  She had PFTs on 11/09/2014 and I reviewed the test results: There was evidence of mild obstruction. She was advised to start Spiriva.  She had asthma as a child, no FHx of OSA.   REVIEW OF SYSTEMS: Out of a complete 14 system review of symptoms, the patient complains only of the following symptoms, and all other reviewed systems are negative.  Frequency of urination, frequent waking, snoring, excessive sweating  ALLERGIES: Allergies  Allergen Reactions  . Influenza Virus Vacc Split Pf Other (See Comments)    "deadly sick "  . Penicillins Hives, Itching and Swelling    HOME MEDICATIONS: Outpatient Medications Prior to Visit  Medication Sig Dispense Refill  . albuterol (PROVENTIL HFA;VENTOLIN HFA) 108 (90 Base) MCG/ACT inhaler Inhale 2 puffs into the lungs every 6 (six) hours as needed for wheezing or shortness of breath. 1 Inhaler 6  . estradiol  (ESTRACE) 2 MG tablet Take 1 tablet (2 mg total) by mouth daily. 30 tablet 6  . fenoprofen (NALFON) 600 MG TABS tablet TAKE 1 TABLET (600 MG TOTAL) BY MOUTH DAILY. 90 tablet 1  . fluconazole (DIFLUCAN) 150 MG tablet Take 1 tab po today and repeat in 1 week 2 tablet 0  . fluticasone furoate-vilanterol (BREO ELLIPTA) 100-25 MCG/INH AEPB Inhale 1 puff into the lungs daily. 4 each 6  . metroNIDAZOLE (FLAGYL) 500 MG tablet Take 1 tablet (500 mg total) by mouth 2 (two) times daily. 14 tablet 0  . nebivolol (BYSTOLIC) 5 MG tablet Take 1 tablet (5 mg total) by mouth daily. 30 tablet 6  . PARoxetine (PAXIL) 20 MG tablet Take 1 tablet (20 mg total) by mouth daily. 30 tablet 6  . Tiotropium Bromide Monohydrate (SPIRIVA RESPIMAT) 1.25 MCG/ACT AERS Inhale 2 puffs into the lungs once. daily 1 Inhaler 4   No facility-administered medications  prior to visit.     PAST MEDICAL HISTORY: Past Medical History:  Diagnosis Date  . Asthma   . Cervical cancer ([place])   . Chest pain   . Fatigue   . H/O: hysterectomy   . Insomnia   . Palpitations   . Paresthesias    lower extremities    PAST SURGICAL HISTORY: Past Surgical History:  Procedure Laterality Date  . ABDOMINAL HYSTERECTOMY    . CERVICAL ABLATION    . INGUINAL HERNIA REPAIR    . UTERINE FIBROID SURGERY      FAMILY HISTORY: Family History  Problem Relation Age of Onset  . Hypertension Mother   . Diabetes Mother   . Osteoarthritis Mother   . Stroke Maternal Grandmother   . Diabetes Maternal Grandmother   . Diabetes Brother   . Hypertension Brother   . HIV/AIDS Brother   . Drug abuse Brother   . Lupus Sister   . Arthritis Sister   . Breast cancer Daughter   . Asthma Father   . Cancer Paternal Grandfather     SOCIAL HISTORY: Social History   Social History  . Marital status: Divorced    Spouse name: N/A  . Number of children: 2  . Years of education: College   Occupational History  . [employer]     Social History Main  Topics  . Smoking status: Former Smoker    Packs/day: 0.50    Types: Cigarettes  . Smokeless tobacco: Never Used     Comment: Stopped 2000  . Alcohol use 2.4 oz/week    4 Cans of beer per week     Comment: wine on weekends  . Drug use: No     Comment: Quit 03/2003  . Sexual activity: Not on file   Other Topics Concern  . Not on file   Social History Narrative   Diet: Regular        Do you drink/ eat things with caffeine? No      Marital status:    Single                           What year were you married ?      Do you live in a house, apartment,assistred living, condo, trailer, etc.)? House      Is it one or more stories? 2      How many persons live in your home ? 2      Do you have any pets in your home ?(please list) No      Current or past profession: Quality Auditor       Do you exercise? Yes                             Type & how often: Walk      Do you have a living will? No      Do you have a DNR form?  No                     If not, do you want to discuss one? Yes      Do you have signed POA?HPOA forms?  No               If so, please bring to your        appointment   Denies caffeine use  PHYSICAL EXAM  Vitals:   05/25/16 0718  BP: (!) 161/86  Pulse: (!) 56  Resp: 18  Weight: 190 lb (86.2 kg)  Height: 5' 7" (1.702 m)   Body mass index is 29.76 kg/m.  Generalized: Well developed, in no acute distress   Neurological examination  Mentation: Alert oriented to time, place, history taking. Follows all commands speech and language fluent Cranial nerve II-XII: Pupils were equal round reactive to light. Extraocular movements were full, visual field were full on confrontational test. Facial sensation and strength were normal. Uvula tongue midline. Head turning and shoulder shrug  were normal and symmetric.Neck circumference 15 inches, Mallampati 3 + Motor: The motor testing reveals 5 over 5 strength of all 4 extremities. Good symmetric motor  tone is noted throughout.  Sensory: Sensory testing is intact to soft touch on all 4 extremities. No evidence of extinction is noted.  Coordination: Cerebellar testing reveals good finger-nose-finger and heel-to-shin bilaterally.  Gait and station: Gait is normal. Reflexes: Deep tendon reflexes are symmetric and normal bilaterally.   DIAGNOSTIC DATA (LABS, IMAGING, TESTING) - I reviewed patient records, labs, notes, testing and imaging myself where available.  Lab Results  Component Value Date   WBC 5.0 04/20/2016   HGB 10.9 (L) 04/20/2016   HCT 34.6 (L) 04/20/2016   MCV 74.1 (L) 04/20/2016   PLT 308 04/20/2016      Component Value Date/Time   NA 138 04/20/2016 0812   NA 140 10/05/2014 1514   K 4.4 04/20/2016 0812   CL 106 04/20/2016 0812   CO2 28 04/20/2016 0812   GLUCOSE 114 (H) 04/20/2016 0812   BUN 14 04/20/2016 0812   BUN 15 10/05/2014 1514   CREATININE 0.61 04/20/2016 0812   CALCIUM 9.1 04/20/2016 0812   PROT 6.9 04/20/2016 0812   PROT 7.0 10/05/2014 1514   ALBUMIN 3.9 04/20/2016 0812   ALBUMIN 4.3 10/05/2014 1514   AST 15 04/20/2016 0812   ALT 14 04/20/2016 0812   ALKPHOS 42 04/20/2016 0812   BILITOT 0.3 04/20/2016 0812   BILITOT 0.3 10/05/2014 1514   GFRNONAA >89 04/20/2016 0812   GFRAA >89 04/20/2016 0812   Lab Results  Component Value Date   CHOL 154 04/20/2016   HDL 47 (L) 04/20/2016   LDLCALC 68 04/20/2016   TRIG 196 (H) 04/20/2016   CHOLHDL 3.3 04/20/2016       ASSESSMENT AND PLAN 56 y.o. year old female  has a past medical history of Asthma; Cervical cancer ([place]); Chest pain; Fatigue; H/O: hysterectomy; Insomnia; Palpitations; and Paresthesias. here with:  1. Obstructive sleep apnea on CPAP  The patient CPAP download indicates good compliance and treatment of her apnea. Patient is encouraged to continue using the CPAP nightly. She is also encouraged to retry melatonin 3-5 mg nightly to see if it offers her any additional benefit. She voiced  understanding. She will follow-up in one year or sooner if needed.     Ward Givens, MSN, NP-C 05/25/2016, 7:44 AM [facility] [address], [place], [place] [address] [phone]  I reviewed the above note and documentation by the Nurse Practitioner and agree with the history, physical exam, assessment and plan as outlined above. I was immediately available for face-to-face consultation. Star Age, MD, PhD [facility])

## 2016-05-25 NOTE — Patient Instructions (Signed)
Continue using CPAP nightly If your symptoms worsen or you develop new symptoms please let us know.   

## 2016-06-25 ENCOUNTER — Ambulatory Visit (INDEPENDENT_AMBULATORY_CARE_PROVIDER_SITE_OTHER): Payer: BLUE CROSS/BLUE SHIELD | Admitting: Nurse Practitioner

## 2016-06-25 ENCOUNTER — Encounter: Payer: Self-pay | Admitting: Nurse Practitioner

## 2016-06-25 VITALS — BP 164/78 | HR 70 | Temp 97.9°F | Resp 18 | Ht 67.0 in | Wt 188.6 lb

## 2016-06-25 DIAGNOSIS — M545 Low back pain, unspecified: Secondary | ICD-10-CM

## 2016-06-25 MED ORDER — NAPROXEN 500 MG PO TABS: 500.0000 mg | ORAL_TABLET | Freq: Two times a day (BID) | ORAL | 0 refills | Status: DC

## 2016-06-25 MED ORDER — TRAMADOL HCL 50 MG PO TABS: 50.0000 mg | ORAL_TABLET | Freq: Four times a day (QID) | ORAL | 0 refills | Status: DC | PRN

## 2016-06-25 MED ORDER — METHOCARBAMOL 500 MG PO TABS: 500.0000 mg | ORAL_TABLET | Freq: Four times a day (QID) | ORAL | 0 refills | Status: DC | PRN

## 2016-06-25 MED ORDER — METHYLPREDNISOLONE ACETATE 40 MG/ML IJ SUSP
40.0000 mg | Freq: Once | INTRAMUSCULAR | Status: AC
  Administered 2016-06-25: 40 mg via INTRAMUSCULAR

## 2016-06-25 NOTE — Patient Instructions (Addendum)
To use robaxin 1-2 tablets every 6 hours as needed for muscle spasms To use naproxen 500 mg by mouth twice daily with meals for 7 days, then as needed pain May use tramadol 50 mg by mouth as needed for severe pain  Cont to use ice for the next day then to use heat

## 2016-06-25 NOTE — Progress Notes (Signed)
Careteam: Patient Care Team: Gildardo Cranker, DO as PCP - General (Internal Medicine)  Advanced Directive information Does Patient Have a Medical Advance Directive?: No  Allergies  Allergen Reactions  . Influenza Virus Vacc Split Pf Other (See Comments)    "deadly sick "  . Penicillins Hives, Itching and Swelling    Chief Complaint  Patient presents with  . Acute Visit    Pt has been having pain and spasms in lower back x 2 days. Pt has taken alieve, biofreeze, and heating pad but nothing has helped.      HPI: Patient is a 55 y.o. 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  Yesterday morning woke up and she was having severe muscle spasms to hear lower back, pain 10/10, had to lay back down and used heat and ice. Felt like something was clawing her back out.  Today she had to go to work but it hurt so bad that she had to leave.  Currently having a lot of back spasm in office-- in tears during interview. Can not stand up or sit down without pain.  ibuprofen 800 mg did not help then started taking aleve which was not effective, muscle rubs and heat but nothing seemed to help   Review of Systems:  Review of Systems  Constitutional: Negative for activity change, appetite change, fatigue and fever.  Gastrointestinal:       Denies loss of bowel or bladder  Musculoskeletal: Positive for arthralgias, back pain and myalgias. Negative for gait problem and neck pain.  Neurological: Negative for numbness.       No numbness or tingling to LE    Past Medical History:  Diagnosis Date  . Asthma   . Cervical cancer ([place])   . Chest pain   . Fatigue   . H/O: hysterectomy   . Insomnia   . Palpitations   . Paresthesias    lower extremities   Past Surgical History:  Procedure Laterality Date  . ABDOMINAL HYSTERECTOMY    . CERVICAL ABLATION    . INGUINAL HERNIA REPAIR    . UTERINE FIBROID  SURGERY     Social History:   reports that she has quit smoking. Her smoking use included Cigarettes. She smoked 0.50 packs per day. She has never used smokeless tobacco. She reports that she drinks about 2.4 oz of alcohol per week . She reports that she does not use drugs.  Family History  Problem Relation Age of Onset  . Hypertension Mother   . Diabetes Mother   . Osteoarthritis Mother   . Stroke Maternal Grandmother   . Diabetes Maternal Grandmother   . Diabetes Brother   . Hypertension Brother   . HIV/AIDS Brother   . Drug abuse Brother   . Lupus Sister   . Arthritis Sister   . Breast cancer Daughter   . Asthma Father   . Cancer Paternal Grandfather     Medications: Patient's Medications  New Prescriptions   No medications on file  Previous Medications   ALBUTEROL (PROVENTIL HFA;VENTOLIN HFA) 108 (90 BASE) MCG/ACT INHALER    Inhale 2 puffs into the lungs every 6 (six) hours as needed for wheezing or shortness of breath.   ESTRADIOL (ESTRACE) 2 MG TABLET    Take 1 tablet (2 mg total) by mouth daily.   FENOPROFEN (NALFON) 600 MG TABS TABLET    TAKE  1 TABLET (600 MG TOTAL) BY MOUTH DAILY.   FLUTICASONE FUROATE-VILANTEROL (BREO ELLIPTA) 100-25 MCG/INH AEPB    Inhale 1 puff into the lungs daily.   NEBIVOLOL (BYSTOLIC) 5 MG TABLET    Take 1 tablet (5 mg total) by mouth daily.   PAROXETINE (PAXIL) 20 MG TABLET    Take 1 tablet (20 mg total) by mouth daily.  Modified Medications   No medications on file  Discontinued Medications   FLUCONAZOLE (DIFLUCAN) 150 MG TABLET    Take 1 tab po today and repeat in 1 week   METRONIDAZOLE (FLAGYL) 500 MG TABLET    Take 1 tablet (500 mg total) by mouth 2 (two) times daily.   TIOTROPIUM BROMIDE MONOHYDRATE (SPIRIVA RESPIMAT) 1.25 MCG/ACT AERS    Inhale 2 puffs into the lungs once. daily     Physical Exam:  Vitals:   06/25/16 1421  BP: (!) 164/78  Pulse: 70  Resp: 18  Temp: 97.9 F (36.6 C)  TempSrc: Oral  SpO2: 98%  Weight: 188 lb  9.6 oz (85.5 kg)  Height: 5\' 7"  (1.702 m)   Body mass index is 29.54 kg/m.  Physical Exam  Constitutional: She is oriented to person, place, and time. She appears well-developed and well-nourished. No distress.  HENT:  Head: Normocephalic and atraumatic.  Mouth/Throat: Oropharynx is clear and moist. No oropharyngeal exudate.  Eyes: Conjunctivae are normal. Pupils are equal, round, and reactive to light.  Neck: Normal range of motion. Neck supple.  Cardiovascular: Normal rate, regular rhythm and normal heart sounds.   Pulmonary/Chest: Effort normal and breath sounds normal.  Abdominal: Soft. Bowel sounds are normal.  Musculoskeletal: She exhibits tenderness. She exhibits no edema.       Lumbar back: She exhibits tenderness, pain and spasm. She exhibits no swelling and no edema.  Neurological: She is alert and oriented to person, place, and time. She has normal strength. She displays no tremor. No cranial nerve deficit or sensory deficit. She exhibits normal muscle tone.  Reflex Scores:      Patellar reflexes are 2+ on the right side and 2+ on the left side. Skin: Skin is warm and dry. She is not diaphoretic.  Psychiatric: She has a normal mood and affect.   Labs reviewed: Basic Metabolic Panel:  Recent Labs  04/20/16 0812  NA 138  K 4.4  CL 106  CO2 28  GLUCOSE 114*  BUN 14  CREATININE 0.61  CALCIUM 9.1  TSH 0.66   Liver Function Tests:  Recent Labs  04/20/16 0812  AST 15  ALT 14  ALKPHOS 42  BILITOT 0.3  PROT 6.9  ALBUMIN 3.9   No results for input(s): LIPASE, AMYLASE in the last 8760 hours. No results for input(s): AMMONIA in the last 8760 hours. CBC:  Recent Labs  04/20/16 0812  WBC 5.0  NEUTROABS 2,800  HGB 10.9*  HCT 34.6*  MCV 74.1*  PLT 308   Lipid Panel:  Recent Labs  04/20/16 0812  CHOL 154  HDL 47*  LDLCALC 68  TRIG 196*  CHOLHDL 3.3   TSH:  Recent Labs  04/20/16 0812  TSH 0.66   A1C: No results found for:  HGBA1C   Assessment/Plan 1. Acute bilateral low back pain without sciatica - methocarbamol (ROBAXIN) 500 MG tablet; Take 1-2 tablets (500-1,000 mg total) by mouth every 6 (six) hours as needed for muscle spasms.  Dispense: 112 tablet; Refill: 0 - naproxen (NAPROSYN) 500 MG tablet; Take 1 tablet (500 mg total) by  mouth 2 (two) times daily with a meal.  Dispense: 60 tablet; Refill: 0 - traMADol (ULTRAM) 50 MG tablet; Take 1 tablet (50 mg total) by mouth every 6 (six) hours as needed.  Dispense: 30 tablet; Refill: 0 - methylPREDNISolone acetate (DEPO-MEDROL) injection 40 mg; Inject 1 mL (40 mg total) into the muscle once. -work note given for her to to be out of work until 05/31/16, prefer to be on light duty -educated on warning signs and when to seek help in ER, pt understands.  Carlos American. Harle Battiest  [facility] [phone] 8 am - 5 pm) [phone] (after hours)

## 2016-07-01 ENCOUNTER — Other Ambulatory Visit: Payer: Self-pay | Admitting: Internal Medicine

## 2016-07-01 DIAGNOSIS — N951 Menopausal and female climacteric states: Secondary | ICD-10-CM

## 2016-08-05 ENCOUNTER — Other Ambulatory Visit: Payer: Self-pay | Admitting: *Deleted

## 2016-08-05 ENCOUNTER — Other Ambulatory Visit: Payer: Self-pay | Admitting: Internal Medicine

## 2016-08-05 DIAGNOSIS — M25562 Pain in left knee: Principal | ICD-10-CM

## 2016-08-05 DIAGNOSIS — M25561 Pain in right knee: Secondary | ICD-10-CM

## 2016-08-05 MED ORDER — FENOPROFEN CALCIUM 600 MG PO TABS: ORAL_TABLET | ORAL | 0 refills | Status: DC

## 2016-08-05 NOTE — Telephone Encounter (Signed)
Patient requested to be faxed to pharmacy 

## 2016-08-06 ENCOUNTER — Telehealth: Payer: Self-pay

## 2016-08-06 DIAGNOSIS — M545 Low back pain, unspecified: Secondary | ICD-10-CM

## 2016-08-06 MED ORDER — IBUPROFEN 800 MG PO TABS: 800.0000 mg | ORAL_TABLET | Freq: Three times a day (TID) | ORAL | 2 refills | Status: DC | PRN

## 2016-08-06 NOTE — Telephone Encounter (Signed)
RX sent, per Janett Billow ok to send for 30 day supply with 2 additional refills

## 2016-08-06 NOTE — Telephone Encounter (Signed)
Message left on clinical intake voicemail:   Please call to discuss was ibuprofen was denied   Left message on voicemail for patient to return call when available, reason for call (not left on voicemail), when patient seen the NP on 06/25/16 she indicated ibuprofen 800 mg was not working and was changed to tramadol (reason ibuprofen was denied)

## 2016-08-06 NOTE — Telephone Encounter (Signed)
Yes this is okay to DC tramadol off medication list- this was for acute pain, and add ibuprofen 800 mg every 8 hours as needed

## 2016-08-06 NOTE — Telephone Encounter (Signed)
Patient states she was in so much pain the day she was seen she was not thinking straight. Patient states she is taking Ibuprofen and would rather take that vs Tramadol.  Patient is requesting a refill to be sent to [facility] on [address], please advise if ok to add Ibuprofen 800 mg back and remove Tramadol

## 2016-09-14 ENCOUNTER — Other Ambulatory Visit: Payer: Self-pay | Admitting: *Deleted

## 2016-09-14 MED ORDER — IBUPROFEN 800 MG PO TABS: 800.0000 mg | ORAL_TABLET | Freq: Three times a day (TID) | ORAL | 0 refills | Status: DC | PRN

## 2016-09-14 NOTE — Telephone Encounter (Signed)
[facility] Request for 90 day supply

## 2016-12-22 ENCOUNTER — Other Ambulatory Visit: Payer: Self-pay | Admitting: Internal Medicine

## 2016-12-22 DIAGNOSIS — N951 Menopausal and female climacteric states: Secondary | ICD-10-CM

## 2017-03-06 ENCOUNTER — Other Ambulatory Visit: Payer: Self-pay | Admitting: Internal Medicine

## 2017-03-06 DIAGNOSIS — N951 Menopausal and female climacteric states: Secondary | ICD-10-CM

## 2017-03-08 NOTE — Telephone Encounter (Signed)
Patient needs to schedule follow up .

## 2017-04-15 ENCOUNTER — Other Ambulatory Visit: Payer: Self-pay | Admitting: Internal Medicine

## 2017-04-15 DIAGNOSIS — N951 Menopausal and female climacteric states: Secondary | ICD-10-CM

## 2017-04-15 NOTE — Telephone Encounter (Signed)
Left message on voicemail for patient to return call when available, reason for call: Schedule appointment  Dr.Carter please advise if ok to refill, last ov 06/25/16 (Acute Visit with Janett Billow), prior appt 04/24/16 ([facility], told to f/u in 6 weeks)  No pending appointment

## 2017-04-16 NOTE — Telephone Encounter (Signed)
Patient called to find out why she is unable to have medication refilled. Pt was told that she needed to schedule an appointment for refills. Pt verbalized understanding and stated that she would call back when she could schedule an appointment.

## 2017-09-13 ENCOUNTER — Other Ambulatory Visit: Payer: Self-pay | Admitting: Obstetrics and Gynecology

## 2017-09-13 ENCOUNTER — Ambulatory Visit
Admission: RE | Admit: 2017-09-13 | Discharge: 2017-09-13 | Disposition: A | Discharge status: Home / Self Care | Payer: BLUE CROSS/BLUE SHIELD | Source: Ambulatory Visit | Attending: Obstetrics and Gynecology | Admitting: Obstetrics and Gynecology

## 2017-09-13 ENCOUNTER — Other Ambulatory Visit (HOSPITAL_COMMUNITY)
Admission: RE | Admit: 2017-09-13 | Discharge: 2017-09-13 | Disposition: A | Discharge status: Home / Self Care | Payer: BLUE CROSS/BLUE SHIELD | Source: Ambulatory Visit | Attending: Obstetrics and Gynecology | Admitting: Obstetrics and Gynecology

## 2017-09-13 DIAGNOSIS — Z01411 Encounter for gynecological examination (general) (routine) with abnormal findings: Secondary | ICD-10-CM | POA: Insufficient documentation

## 2017-09-13 DIAGNOSIS — Z1231 Encounter for screening mammogram for malignant neoplasm of breast: Secondary | ICD-10-CM

## 2017-09-14 LAB — CYTOLOGY - PAP
DIAGNOSIS: NEGATIVE
HPV: NOT DETECTED

## 2018-11-02 ENCOUNTER — Ambulatory Visit: Payer: BC Managed Care – PPO | Admitting: Nurse Practitioner

## 2018-11-02 ENCOUNTER — Other Ambulatory Visit: Payer: Self-pay

## 2018-11-02 ENCOUNTER — Encounter: Payer: Self-pay | Admitting: Nurse Practitioner

## 2018-11-02 VITALS — BP 150/86 | HR 100 | Temp 98.4°F | Ht 65.2 in | Wt 192.0 lb

## 2018-11-02 DIAGNOSIS — M25561 Pain in right knee: Secondary | ICD-10-CM

## 2018-11-02 DIAGNOSIS — G8929 Other chronic pain: Secondary | ICD-10-CM

## 2018-11-02 DIAGNOSIS — J45909 Unspecified asthma, uncomplicated: Secondary | ICD-10-CM

## 2018-11-02 DIAGNOSIS — Z9989 Dependence on other enabling machines and devices: Secondary | ICD-10-CM | POA: Diagnosis not present

## 2018-11-02 DIAGNOSIS — G4733 Obstructive sleep apnea (adult) (pediatric): Secondary | ICD-10-CM | POA: Diagnosis not present

## 2018-11-02 DIAGNOSIS — I10 Essential (primary) hypertension: Secondary | ICD-10-CM | POA: Diagnosis not present

## 2018-11-02 DIAGNOSIS — Z23 Encounter for immunization: Secondary | ICD-10-CM | POA: Diagnosis not present

## 2018-11-02 DIAGNOSIS — R4586 Emotional lability: Secondary | ICD-10-CM

## 2018-11-02 DIAGNOSIS — M25562 Pain in left knee: Secondary | ICD-10-CM

## 2018-11-02 DIAGNOSIS — N951 Menopausal and female climacteric states: Secondary | ICD-10-CM

## 2018-11-02 LAB — POCT URINALYSIS DIPSTICK
Bilirubin, UA: NEGATIVE
Glucose, UA: NEGATIVE
Ketones, UA: NEGATIVE
Leukocytes, UA: NEGATIVE
Nitrite, UA: NEGATIVE
Protein, UA: NEGATIVE
Spec Grav, UA: 1.025 (ref 1.010–1.025)
Urobilinogen, UA: 0.2 E.U./dL
pH, UA: 6.5 (ref 5.0–8.0)

## 2018-11-02 LAB — POCT UA - MICROALBUMIN
Albumin/Creatinine Ratio, Urine, POC: 30
Creatinine, POC: 100 mg/dL
Microalbumin Ur, POC: 10 mg/L

## 2018-11-02 MED ORDER — PAROXETINE HCL 10 MG PO TABS: 10.0000 mg | ORAL_TABLET | Freq: Every day | ORAL | 2 refills | Status: DC

## 2018-11-02 MED ORDER — LOSARTAN POTASSIUM 25 MG PO TABS: 25.0000 mg | ORAL_TABLET | Freq: Every day | ORAL | 2 refills | Status: DC

## 2018-11-02 MED ORDER — ALBUTEROL SULFATE HFA 108 (90 BASE) MCG/ACT IN AERS: 2.0000 | INHALATION_SPRAY | Freq: Four times a day (QID) | RESPIRATORY_TRACT | 2 refills | Status: DC | PRN

## 2018-11-02 MED ORDER — DICLOFENAC SODIUM 1 % TD GEL: 2.0000 g | Freq: Four times a day (QID) | TRANSDERMAL | 2 refills | Status: DC

## 2018-11-02 MED ORDER — BREO ELLIPTA 100-25 MCG/INH IN AEPB: 1.0000 | INHALATION_SPRAY | Freq: Every day | RESPIRATORY_TRACT | 6 refills | Status: DC

## 2018-11-02 NOTE — Progress Notes (Signed)
Subjective:     Patient ID: Karen Molina , female    DOB: 18-Jan-1961 , 58 y.o.   MRN: [id number]   Chief Complaint  Patient presents with  . Establish Care  . Hypertension  . Sleep apnea  . Immunizations    Tdap    HPI  Here to establish care.  She was supposed to go to [place] prior to the pandemic.  She was going to a provider across from [place].  Hysterectomy, she also has a history of cervical cancer.  She has one ovary.   PMH - hypertension.   Hysterectomy over 10 years.  She has flushing from chest up she will cough and sneeze. She has tried Paxil low dose but was not working, caused her moods to be "bad".  She was able to tell the difference herself.  She does admit to eating increased amount of pasta, no bread. No pork.  Drink mostly water.  Juice is sugar free.  She has just started back walking.    Hypertension This is a chronic problem. The current episode started more than 1 year ago (she has been without her bystolic for 2 years.  ). Pertinent negatives include no chest pain, headaches or palpitations.     Past Medical History:  Diagnosis Date  . Asthma   . Cervical cancer ([place])   . Chest pain   . Fatigue   . H/O: hysterectomy   . Insomnia   . Palpitations   . Paresthesias    lower extremities     Family History  Problem Relation Age of Onset  . Hypertension Mother   . Diabetes Mother   . Osteoarthritis Mother   . Stroke Maternal Grandmother   . Diabetes Maternal Grandmother   . Diabetes Brother   . Hypertension Brother   . HIV/AIDS Brother   . Drug abuse Brother   . Lupus Sister   . Arthritis Sister   . Breast cancer Daughter   . Asthma Father   . Healthy Brother   . Healthy Brother   . Healthy Son   . Cancer Paternal Grandfather      Current Outpatient Medications:  .  Ferrous Sulfate (IRON) 325 (65 Fe) MG TABS, Take by mouth. 1 per day, Disp: , Rfl:  .  Magnesium 250 MG TABS, Take by mouth. 1 per day, Disp: , Rfl:  .   albuterol (PROVENTIL HFA;VENTOLIN HFA) 108 (90 Base) MCG/ACT inhaler, Inhale 2 puffs into the lungs every 6 (six) hours as needed for wheezing or shortness of breath. (Patient not taking: Reported on 11/02/2018), Disp: 1 Inhaler, Rfl: 6 .  estradiol (ESTRACE) 2 MG tablet, APPOINTMENT DUE, take 1 tablet by mouth daily (Patient not taking: Reported on 11/02/2018), Disp: 30 tablet, Rfl: 0 .  fenoprofen (NALFON) 600 MG TABS tablet, Take one tablet by mouth once daily (Patient not taking: Reported on 11/02/2018), Disp: 90 tablet, Rfl: 0 .  fluticasone furoate-vilanterol (BREO ELLIPTA) 100-25 MCG/INH AEPB, Inhale 1 puff into the lungs daily. (Patient not taking: Reported on 11/02/2018), Disp: 4 each, Rfl: 6 .  ibuprofen (ADVIL,MOTRIN) 800 MG tablet, Take 1 tablet (800 mg total) by mouth every 8 (eight) hours as needed for moderate pain. (Patient not taking: Reported on 11/02/2018), Disp: 270 tablet, Rfl: 0 .  methocarbamol (ROBAXIN) 500 MG tablet, Take 1-2 tablets (500-1,000 mg total) by mouth every 6 (six) hours as needed for muscle spasms. (Patient not taking: Reported on 11/02/2018), Disp: 112 tablet, Rfl: 0 .  naproxen (NAPROSYN) 500 MG tablet, Take 1 tablet (500 mg total) by mouth 2 (two) times daily with a meal. (Patient not taking: Reported on 11/02/2018), Disp: 60 tablet, Rfl: 0 .  nebivolol (BYSTOLIC) 5 MG tablet, Take 1 tablet (5 mg total) by mouth daily. (Patient not taking: Reported on 11/02/2018), Disp: 30 tablet, Rfl: 6 .  PARoxetine (PAXIL) 20 MG tablet, TAKE 1 TABLET EVERY DAY (Patient not taking: Reported on 11/02/2018), Disp: 30 tablet, Rfl: 3   Allergies  Allergen Reactions  . Influenza Virus Vacc Split Pf Other (See Comments)    "deadly sick "  . Penicillins Hives, Itching and Swelling     Review of Systems  Constitutional: Negative.   Respiratory: Negative.   Cardiovascular: Negative.  Negative for chest pain, palpitations and leg swelling.  Neurological: Negative for dizziness and  headaches.     Today's Vitals   11/02/18 1533  BP: (!) 150/86  Pulse: 100  Temp: 98.4 F (36.9 C)  TempSrc: Oral  Weight: 192 lb (87.1 kg)  Height: 5' 5.2" (1.656 m)   Body mass index is 31.75 kg/m.   Objective:  Physical Exam Vitals signs reviewed.  Cardiovascular:     Rate and Rhythm: Normal rate and regular rhythm.     Pulses: Normal pulses.     Heart sounds: Normal heart sounds. No murmur.  Pulmonary:     Effort: Pulmonary effort is normal.     Breath sounds: Normal breath sounds.  Musculoskeletal: Normal range of motion.        General: No swelling, tenderness or deformity.     Right lower leg: No edema.     Left lower leg: No edema.  Skin:    General: Skin is warm and dry.     Capillary Refill: Capillary refill takes less than 2 seconds.  Neurological:     General: No focal deficit present.     Mental Status: She is alert and oriented to person, place, and time.  Psychiatric:        Mood and Affect: Mood normal.        Behavior: Behavior normal.        Thought Content: Thought content normal.        Judgment: Judgment normal.         Assessment And Plan:     1. Encounter for immunization   tetanus vaccine was given today while in office. Refer to order management. - Tdap vaccine greater than or equal to 7yo IM  2. Essential hypertension B/P is fairly controlled.  This is her first time here at the office EKG done NSR CMP ordered to check renal function.  The importance of regular exercise and dietary modification was stressed to the patient.  Stressed importance of losing ten percent of her body weight to help with B/P control.  The weight loss would help with decreasing cardiac and cancer risk as well.  - EKG 12-Lead  3. Extrinsic asthma, unspecified asthma severity, uncomplicated  Chronic  Will send Rx for her Breo and albuterol  4. OSA on CPAP  Chronic  Wears CPAP however did not have the supplies due to losing her job, she needs a new  machine advised to contact [facility] for follow up to get appropriate settings  5. Hot flashes, menopausal  This is affecting her daily life style with having episodes of diaphoresis  I will try her on paxil daily   She is to also avoid high sugar and high carbohydrate foods  At this time will not start estrogen due to elevated blood pressure  7. Chronic pain of both knees  She has not been seen by orthopedics in the past  Will try her on diclofenac gel   7. Mood changes  This may be related to her going through menopause  Will try low dose Paxil, discussed side effects of dry mouth or thoughts that are not normal for her to contact office - PARoxetine (PAXIL) 10 MG tablet; Take 1 tablet (10 mg total) by mouth daily.  Dispense: 30 tablet; Refill: 2    Minette Brine, FNP    THE PATIENT IS ENCOURAGED TO PRACTICE SOCIAL DISTANCING DUE TO THE COVID-19 PANDEMIC.

## 2018-11-03 ENCOUNTER — Telehealth: Payer: Self-pay | Admitting: Adult Health

## 2018-11-03 LAB — CBC WITH DIFFERENTIAL/PLATELET
Basophils Absolute: 0 10*3/uL (ref 0.0–0.2)
Basos: 0 %
EOS (ABSOLUTE): 0.1 10*3/uL (ref 0.0–0.4)
Eos: 1 %
Hematocrit: 37.6 % (ref 34.0–46.6)
Hemoglobin: 12.1 g/dL (ref 11.1–15.9)
Immature Grans (Abs): 0 10*3/uL (ref 0.0–0.1)
Immature Granulocytes: 0 %
Lymphocytes Absolute: 2.4 10*3/uL (ref 0.7–3.1)
Lymphs: 29 %
MCH: 23.5 pg — ABNORMAL LOW (ref 26.6–33.0)
MCHC: 32.2 g/dL (ref 31.5–35.7)
MCV: 73 fL — ABNORMAL LOW (ref 79–97)
Monocytes Absolute: 0.7 10*3/uL (ref 0.1–0.9)
Monocytes: 8 %
Neutrophils Absolute: 5.1 10*3/uL (ref 1.4–7.0)
Neutrophils: 62 %
Platelets: 300 10*3/uL (ref 150–450)
RBC: 5.15 x10E6/uL (ref 3.77–5.28)
RDW: 16.8 % — ABNORMAL HIGH (ref 11.7–15.4)
WBC: 8.3 10*3/uL (ref 3.4–10.8)

## 2018-11-03 LAB — TSH: TSH: 1.12 u[IU]/mL (ref 0.450–4.500)

## 2018-11-03 LAB — THYROID ANTIBODIES
Thyroglobulin Antibody: <1 IU/mL (ref 0.0–0.9)
Thyroperoxidase Ab SerPl-aCnc: <9 IU/mL (ref 0–34)

## 2018-11-03 LAB — T3: T3, Total: 118 ng/dL (ref 71–180)

## 2018-11-03 LAB — T4, FREE: Free T4: 1.17 ng/dL (ref 0.82–1.77)

## 2018-11-03 NOTE — Telephone Encounter (Signed)
Pt is calling in requesting her CPAP order sent or faxed to her PCP so that she can get back started , and her PCP can send to [place]  Fax# [phone]

## 2018-11-07 NOTE — Telephone Encounter (Signed)
I spoke to pt.  She owns her machine.  She needs appt but has issues with copay.  Will call and speak to billing then call back to make appt with MM/NP or AL/NP.  She verbalized understanding.

## 2018-11-07 NOTE — Telephone Encounter (Signed)
LMVM for pt to return call to schedule appt with NP.

## 2018-11-09 ENCOUNTER — Ambulatory Visit: Payer: BC Managed Care – PPO | Admitting: Family Medicine

## 2018-11-09 ENCOUNTER — Other Ambulatory Visit: Payer: Self-pay

## 2018-11-09 ENCOUNTER — Encounter: Payer: Self-pay | Admitting: Family Medicine

## 2018-11-09 VITALS — BP 150/90 | HR 82 | Temp 97.1°F | Ht 65.5 in | Wt 193.8 lb

## 2018-11-09 DIAGNOSIS — Z9989 Dependence on other enabling machines and devices: Secondary | ICD-10-CM

## 2018-11-09 DIAGNOSIS — G4733 Obstructive sleep apnea (adult) (pediatric): Secondary | ICD-10-CM | POA: Diagnosis not present

## 2018-11-09 NOTE — Progress Notes (Addendum)
PATIENT: Karen Molina DOB: [birth year]  REASON FOR VISIT: follow up HISTORY FROM: patient  Chief Complaint  Patient presents with   Follow-up    Room 1, alone. OSA f/u. "works well. 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     HISTORY OF PRESENT ILLNESS: Today 11/09/18 Karen Molina is a 58 y.o. female here today for follow up for OSA on CPAP.  She states that in 2018 she lost her job.  She was unable to continue CPAP therapy due to financial concerns.  Fortunately, she has now insured and is ready to get started on therapy.  She had reached out for new supplies but was told she needed to follow-up.  Compliance report dated 10/10/2018 through 11/08/2018 reveals that she has used CPAP 3 out of the last 30 days for compliance of 10%.  She did not use CPAP greater than 4 hours any of those days.  AHI was 2.3 on 16 cm of water.  There was a leak noted in the 95th percentile 43.2.  She is very motivated to get back on therapy.  She is working with primary care to manage elevated blood pressures.  She is taken Cozaar 25 mg daily as prescribed.  HISTORY: (copied from [place] note on 05/25/2016)  Ms. Karen Molina is a 58 year old female with a history of obstructive sleep apnea on CPAP. She returns today for a compliance download. Her download indicates that she is using her machine 27 out of 30 days for compliance of 90%. She uses her machine greater than 4 hours 24 out of 30 days for compliance of 80%. On average she uses her machine 7 hours and 7 minutes. Her residual AHI is 2.2 on 16 cm [place] EPR 3. She does not have a significant leak. She feels that the CPAP is still working well for her. She states she does not want to sleep without it. She does state that she will wake up during the night atleast once and have a hard time going back to sleep. She states that she would like to be able to sleep through the night without waking up. She has tried low-dose melatonin but for only one or 2  nights. She returns today for an evaluation.   HISTORY 11/27/15: Ms. Karen Molina is a 58 year old right-handed woman with an underlying medical history of asthma, chest pain, palpitations, and overweight state, who presents for follow-up consultation of her obstructive sleep apnea, after her recent sleep studies. The patient is unaccompanied today. Her first met her on 06/17/2015 at the request of her primary care physician, at which time she reported snoring and excessive daytime somnolence, as well as witnessed breathing pauses while asleep per family. I invited her back for sleep study. She had a baseline sleep study, followed by a CPAP titration study. I went over her test results with her in detail today. Her baseline sleep study from 08/22/2015 showed a sleep efficiency of 78.3% with a latency to sleep of 27 minutes and wake after sleep onset of 59.5 minutes with moderate to severe sleep fragmentation noted. She had an elevated arousal index. She had an increased percentage of stage I and stage II sleep, absence of slow-wave sleep and absence of REM sleep. She had no significant PLMS, EKG or EEG changes. She had snoring and the supine and lateral positions, overall AHI was 28.7 per hour, average oxygen saturation 91%, nadir was 77%. Based on her test results and her sleep-related complaints I invited her  back for a full night CPAP titration study. She had this on 09/16/2015. Sleep efficiency was 88.8% with a latency to sleep of 7 minutes and wake after sleep onset of 37 minutes with mild to moderate sleep fragmentation noted. She had an increased percentage of stage II sleep, absence of slow-wave sleep and REM sleep was 15.4% with a mildly prolonged REM latency of 125 minutes. She had no significant PLMS, EKG or EEG changes. Average oxygen saturation was 97%, nadir was 83%. She was titrated on CPAP from 5 cm to 16 cm. AHI was 0 per hour at the final pressure during non-REM sleep. She had REM sleep on the pressure  of 14 cm with the residual AHI of 7.2 per hour. Based on her test results I prescribed CPAP therapy at a pressure of 16 cm via full facemask.  Today, 11/27/2015: I reviewed her CPAP compliance data from 10/26/2015 through 11/24/2015 which is a total of 30 days, during which time she used her machine 28 days with percent used days greater than 4 hours at 93%, indicating excellent compliance with an average usage for all days of 7 hours and 4 minutes, residual AHI 1 per hour, leak at times high with the 95th percentile at 23.7 L/m on a pressure of 16 cm with EPR of 3.  Today, 11/27/2015: She reports doing well, compliant with treatment, feels a little better with her sleep, less sluggish. Nocturia is a little less. Dreams more. Unfortunately, she has had night sweats with hot flashes and her FFM tends to leak and dislodge. Per BF, she no longer snores. She was treated for left shoulder pain recently. She was given prednisone as well his pain medication. She finished the prednisone and is nolonger on narcotic pain medication.  Previously:  06/17/2015: She reports snoring and excessive daytime somnolence as well as witnessed breathing pauses while asleep, per family. She lives with her 86 yo son (she has a 86 yo daughter as well).  She works from [employer] sometimes 8 PM. She is currently restricted to 8 hours. She has been having some nasal congestion, some drainage lately, feels like she is coming down with the cold.  Typical bedtime is between 8:30 and 9 PM. She does not have trouble going to sleep but has trouble staying asleep and reports multiple nighttime awakenings and nocturia about 4-5 times each night. She denies morning headaches. She had restless leg symptoms in the past but not recently. She has no family history of RLS. Her Epworth sleepiness score is 12 out of 24 today, her fatigue score is 20 out of 63. She quit smoking in 2000, quit smoking marijuana in 2005, drinks alcohol occasionally,  in the form of wine on weekends. She does not drink caffeine on a daily basis.  I reviewed your office note from 05/10/2015.  She had PFTs on 11/09/2014 and I reviewed the test results: There was evidence of mild obstruction. She was advised to start Spiriva.  She had asthma as a child, no FHx of OSA.    REVIEW OF SYSTEMS: Out of a complete 14 system review of symptoms, the patient complains only of the following symptoms, dizziness, numbness, weakness and all other reviewed systems are negative.  ALLERGIES: Allergies  Allergen Reactions   Influenza Virus Vacc Split Pf Other (See Comments)    "deadly sick "   Penicillins Hives, Itching and Swelling    HOME MEDICATIONS: Outpatient Medications Prior to Visit  Medication Sig Dispense Refill   albuterol (  VENTOLIN HFA) 108 (90 Base) MCG/ACT inhaler Inhale 2 puffs into the lungs every 6 (six) hours as needed for wheezing or shortness of breath. 18 g 2   diclofenac sodium (VOLTAREN) 1 % GEL Apply 2 g topically 4 (four) times daily. 50 g 2   estradiol (ESTRACE) 0.1 MG/GM vaginal cream Place 1 Applicatorful vaginally daily as needed. When needed     Ferrous Sulfate (IRON) 325 (65 Fe) MG TABS Take by mouth. 1 per day     fluticasone furoate-vilanterol (BREO ELLIPTA) 100-25 MCG/INH AEPB Inhale 1 puff into the lungs daily. 4 each 6   losartan (COZAAR) 25 MG tablet Take 1 tablet (25 mg total) by mouth daily. 30 tablet 2   Magnesium 250 MG TABS Take by mouth. 1 per day     PARoxetine (PAXIL) 10 MG tablet Take 1 tablet (10 mg total) by mouth daily. 30 tablet 2   No facility-administered medications prior to visit.     PAST MEDICAL HISTORY: Past Medical History:  Diagnosis Date   Asthma    Cervical cancer ([place])    Chest pain    Fatigue    H/O: hysterectomy    Insomnia    Palpitations    Paresthesias    lower extremities    PAST SURGICAL HISTORY: Past Surgical History:  Procedure Laterality Date   ABDOMINAL  HYSTERECTOMY     CERVICAL ABLATION     INGUINAL HERNIA REPAIR     UTERINE FIBROID SURGERY      FAMILY HISTORY: Family History  Problem Relation Age of Onset   Hypertension Mother    Diabetes Mother    Osteoarthritis Mother    Stroke Maternal Grandmother    Diabetes Maternal Grandmother    Diabetes Brother    Hypertension Brother    HIV/AIDS Brother    Drug abuse Brother    Lupus Sister    Arthritis Sister    Breast cancer Daughter    Asthma Father    Healthy Brother    Healthy Brother    Healthy Son    Cancer Paternal Grandfather     SOCIAL HISTORY: Social History   Socioeconomic History   Marital status: Divorced    Spouse name: Not on file   Number of children: 2   Years of education: College   Highest education level: Not on file  Occupational History   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 strain: Not on file   Food insecurity    Worry: Not on file    Inability: Not on file   Transportation needs    Medical: Not on file    Non-medical: Not on file  Tobacco Use   Smoking status: Former Smoker    Packs/day: 0.50    Years: 20.00    Pack years: 10.00    Types: Cigarettes   Smokeless tobacco: Never Used   Tobacco comment: Stopped 2000  Substance and Sexual Activity   Alcohol use: Yes    Alcohol/week: 4.0 standard drinks    Types: 4 Cans of beer per week    Comment: wine on weekends   Drug use: No    Comment: Quit 03/2003   Sexual activity: Not on file  Lifestyle   Physical activity    Days per week: Not on file    Minutes per session: Not on file   Stress: Not on file  Relationships   Social connections    Talks on phone: Not on file  Gets together: Not on file    Attends religious service: Not on file    Active member of club or organization: Not on file    Attends meetings of clubs or organizations: Not on file    Relationship status: Not on file   Intimate partner violence     Fear of current or ex partner: Not on file    Emotionally abused: Not on file    Physically abused: Not on file    Forced sexual activity: Not on file  Other Topics Concern   Not on file  Social History Narrative   Diet: Regular        Do you drink/ eat things with caffeine? No      Marital status:    Single                           What year were you married ?      Do you live in a house, apartment,assistred living, condo, trailer, etc.)? House      Is it one or more stories? 2      How many persons live in your home ? 2      Do you have any pets in your home ?(please list) No      Current or past profession: Quality Auditor       Do you exercise? Yes                             Type & how often: Walk      Do you have a living will? No      Do you have a DNR form?  No                     If not, do you want to discuss one? Yes      Do you have signed POA?HPOA forms?  No               If so, please bring to your        appointment   Denies caffeine use       PHYSICAL EXAM  Vitals:   11/09/18 1417  BP: (!) 150/90  Pulse: 82  Temp: (!) 97.1 F (36.2 C)  Weight: 193 lb 12.8 oz (87.9 kg)  Height: 5' 5.5" (1.664 m)   Body mass index is 31.76 kg/m.  Generalized: Well developed, in no acute distress  Mallampati 2+, neck circ 15" Neurological examination  Mentation: Alert oriented to time, place, history taking. Follows all commands speech and language fluent Cranial nerve II-XII: Pupils were equal round reactive to light. Extraocular movements were full, visual field were full on confrontational test. Facial sensation and strength were normal. Uvula tongue midline. Head turning and shoulder shrug  were normal and symmetric. Motor: The motor testing reveals 5 over 5 strength of all 4 extremities. Good symmetric motor tone is noted throughout.  Sensory: Sensory testing is intact to soft touch on all 4 extremities. No evidence of extinction is noted.  Coordination:  Cerebellar testing reveals good finger-nose-finger and heel-to-shin bilaterally.  Gait and station: Gait is normal.  DIAGNOSTIC DATA (LABS, IMAGING, TESTING) - I reviewed patient records, labs, notes, testing and imaging myself where available.  No flowsheet data found.   Lab Results  Component Value Date   WBC 8.3 11/02/2018   HGB 12.1 11/02/2018  HCT 37.6 11/02/2018   MCV 73 (L) 11/02/2018   PLT 300 11/02/2018      Component Value Date/Time   NA 138 04/20/2016 0812   NA 140 10/05/2014 1514   K 4.4 04/20/2016 0812   CL 106 04/20/2016 0812   CO2 28 04/20/2016 0812   GLUCOSE 114 (H) 04/20/2016 0812   BUN 14 04/20/2016 0812   BUN 15 10/05/2014 1514   CREATININE 0.61 04/20/2016 0812   CALCIUM 9.1 04/20/2016 0812   PROT 6.9 04/20/2016 0812   PROT 7.0 10/05/2014 1514   ALBUMIN 3.9 04/20/2016 0812   ALBUMIN 4.3 10/05/2014 1514   AST 15 04/20/2016 0812   ALT 14 04/20/2016 0812   ALKPHOS 42 04/20/2016 0812   BILITOT 0.3 04/20/2016 0812   BILITOT 0.3 10/05/2014 1514   GFRNONAA >89 04/20/2016 0812   GFRAA >89 04/20/2016 0812   Lab Results  Component Value Date   CHOL 154 04/20/2016   HDL 47 (L) 04/20/2016   LDLCALC 68 04/20/2016   TRIG 196 (H) 04/20/2016   CHOLHDL 3.3 04/20/2016   No results found for: HGBA1C No results found for: VITAMINB12 Lab Results  Component Value Date   TSH 1.120 11/02/2018       ASSESSMENT AND PLAN 58 y.o. year old female  has a past medical history of Asthma, Cervical cancer ([place]), Chest pain, Fatigue, H/O: hysterectomy, Insomnia, Palpitations, and Paresthesias. here with     ICD-10-CM   1. OSA on CPAP  G47.33 For home use only DME continuous positive airway pressure (CPAP)   Z99.89     Unfortunately Ms. Smoot has been uninsured and unable to continue CPAP therapy.  She is very motivated and excited about getting started.  We will send order today for new supplies.  She was encouraged to use CPAP therapy nightly and for greater than 4  hours each night.  I will repeat download in 1 month.  If AHI is well managed we will follow-up in 1 year.  If there are any concerns we will bring her back in for review.  She verbalizes understanding and agreement with this plan.   Orders Placed This Encounter  Procedures   For home use only DME continuous positive airway pressure (CPAP)    Supplies please    Order Specific Question:   Length of Need    Answer:   Lifetime    Order Specific Question:   Patient has OSA or probable OSA    Answer:   Yes    Order Specific Question:   Is the patient currently using CPAP in the home    Answer:   Yes    Order Specific Question:   Settings    Answer:   Other see comments    Order Specific Question:   CPAP supplies needed    Answer:   Mask, headgear, cushions, filters, heated tubing and water chamber     No orders of the defined types were placed in this encounter.     I spent 15 minutes with the patient. 50% of this time was spent counseling and educating patient on plan of care and medications.    Debbora Presto, FNP-C 11/09/2018, 2:52 PM [facility] [address], [place], [place] [address] [phone]  I reviewed the above note and documentation by the Nurse Practitioner and agree with the history, exam, assessment and plan as outlined above. I was immediately available for consultation. Star Age, MD, PhD [facility])

## 2018-11-09 NOTE — Patient Instructions (Signed)
Continue CPAP nightly and for greater than 4 hours each night  We will repeat download in 1 month, I will call with recommended follow up based on results.   Sleep Apnea Sleep apnea affects breathing during sleep. It causes breathing to stop for a short time or to become shallow. It can also increase the risk of:  Heart attack.  Stroke.  Being very overweight (obese).  Diabetes.  Heart failure.  Irregular heartbeat. The goal of treatment is to help you breathe normally again. What are the causes? There are three kinds of sleep apnea:  Obstructive sleep apnea. This is caused by a blocked or collapsed airway.  Central sleep apnea. This happens when the brain does not send the right signals to the muscles that control breathing.  Mixed sleep apnea. This is a combination of obstructive and central sleep apnea. The most common cause of this condition is a collapsed or blocked airway. This can happen if:  Your throat muscles are too relaxed.  Your tongue and tonsils are too large.  You are overweight.  Your airway is too small. What increases the risk?  Being overweight.  Smoking.  Having a small airway.  Being older.  Being female.  Drinking alcohol.  Taking medicines to calm yourself (sedatives or tranquilizers).  Having family members with the condition. What are the signs or symptoms?  Trouble staying asleep.  Being sleepy or tired during the day.  Getting angry a lot.  Loud snoring.  Headaches in the morning.  Not being able to focus your mind (concentrate).  Forgetting things.  Less interest in sex.  Mood swings.  Personality changes.  Feelings of sadness (depression).  Waking up a lot during the night to pee (urinate).  Dry mouth.  Sore throat. How is this diagnosed?  Your medical history.  A physical exam.  A test that is done when you are sleeping (sleep study). The test is most often done in a sleep lab but may also be done  at home. How is this treated?   Sleeping on your side.  Using a medicine to get rid of mucus in your nose (decongestant).  Avoiding the use of alcohol, medicines to help you relax, or certain pain medicines (narcotics).  Losing weight, if needed.  Changing your diet.  Not smoking.  Using a machine to open your airway while you sleep, such as: ? An oral appliance. This is a mouthpiece that shifts your lower jaw forward. ? A CPAP device. This device blows air through a mask when you breathe out (exhale). ? An EPAP device. This has valves that you put in each nostril. ? A BPAP device. This device blows air through a mask when you breathe in (inhale) and breathe out.  Having surgery if other treatments do not work. It is important to get treatment for sleep apnea. Without treatment, it can lead to:  High blood pressure.  Coronary artery disease.  In men, not being able to have an erection (impotence).  Reduced thinking ability. Follow these instructions at home: Lifestyle  Make changes that your doctor recommends.  Eat a healthy diet.  Lose weight if needed.  Avoid alcohol, medicines to help you relax, and some pain medicines.  Do not use any products that contain nicotine or tobacco, such as cigarettes, e-cigarettes, and chewing tobacco. If you need help quitting, ask your doctor. General instructions  Take over-the-counter and prescription medicines only as told by your doctor.  If you were given a  machine to use while you sleep, use it only as told by your doctor.  If you are having surgery, make sure to tell your doctor you have sleep apnea. You may need to bring your device with you.  Keep all follow-up visits as told by your doctor. This is important. Contact a doctor if:  The machine that you were given to use during sleep bothers you or does not seem to be working.  You do not get better.  You get worse. Get help right away if:  Your chest hurts.   You have trouble breathing in enough air.  You have an uncomfortable feeling in your back, arms, or stomach.  You have trouble talking.  One side of your body feels weak.  A part of your face is hanging down. These symptoms may be an emergency. Do not wait to see if the symptoms will go away. Get medical help right away. Call your local emergency services (911 in the [place] Do not drive yourself to the hospital. Summary  This condition affects breathing during sleep.  The most common cause is a collapsed or blocked airway.  The goal of treatment is to help you breathe normally while you sleep. This information is not intended to replace advice given to you by your health care provider. Make sure you discuss any questions you have with your health care provider. Document Released: 12/17/2007 Document Revised: 12/24/2017 Document Reviewed: 11/02/2017 Elsevier Patient Education  2020 [employer].

## 2018-11-24 ENCOUNTER — Other Ambulatory Visit: Payer: Self-pay | Admitting: Nurse Practitioner

## 2018-11-24 DIAGNOSIS — I10 Essential (primary) hypertension: Secondary | ICD-10-CM

## 2018-11-30 ENCOUNTER — Ambulatory Visit: Payer: BC Managed Care – PPO | Admitting: Nurse Practitioner

## 2018-11-30 ENCOUNTER — Encounter: Payer: Self-pay | Admitting: Nurse Practitioner

## 2018-11-30 ENCOUNTER — Other Ambulatory Visit: Payer: Self-pay

## 2018-11-30 VITALS — BP 150/104 | HR 84 | Temp 98.9°F | Ht 65.2 in | Wt 193.2 lb

## 2018-11-30 DIAGNOSIS — R4586 Emotional lability: Secondary | ICD-10-CM

## 2018-11-30 DIAGNOSIS — N951 Menopausal and female climacteric states: Secondary | ICD-10-CM

## 2018-11-30 DIAGNOSIS — I10 Essential (primary) hypertension: Secondary | ICD-10-CM | POA: Diagnosis not present

## 2018-11-30 MED ORDER — AMLODIPINE BESYLATE 10 MG PO TABS: 10.0000 mg | ORAL_TABLET | Freq: Every day | ORAL | 2 refills | Status: DC

## 2018-11-30 MED ORDER — PAROXETINE HCL 20 MG PO TABS: 20.0000 mg | ORAL_TABLET | Freq: Every day | ORAL | 2 refills | Status: DC

## 2018-11-30 NOTE — Progress Notes (Signed)
Subjective:     Patient ID: Karen Molina , female    DOB: 04-20-1960 , 58 y.o.   MRN: [id number]   Chief Complaint  Patient presents with  . Hypertension    HPI  She has her CPAP machine supplies and feels like she is sleeping like a baby.  She continues to have mood swings.    Hypertension This is a chronic problem. The current episode started more than 1 year ago. The problem has been gradually worsening since onset. The problem is controlled. Pertinent negatives include no anxiety, blurred vision, chest pain, headaches, malaise/fatigue or palpitations. Risk factors for coronary artery disease include diabetes mellitus, sedentary lifestyle, obesity and female gender. Past treatments include angiotensin blockers. The current treatment provides no improvement. There are no compliance problems.  There is no history of angina or kidney disease. There is no history of chronic renal disease or sleep apnea.     Past Medical History:  Diagnosis Date  . Asthma   . Cervical cancer ([place])   . Chest pain   . Fatigue   . H/O: hysterectomy   . Insomnia   . Palpitations   . Paresthesias    lower extremities     Family History  Problem Relation Age of Onset  . Hypertension Mother   . Diabetes Mother   . Osteoarthritis Mother   . Stroke Maternal Grandmother   . Diabetes Maternal Grandmother   . Diabetes Brother   . Hypertension Brother   . HIV/AIDS Brother   . Drug abuse Brother   . Lupus Sister   . Arthritis Sister   . Breast cancer Daughter   . Asthma Father   . Healthy Brother   . Healthy Brother   . Healthy Son   . Cancer Paternal Grandfather      Current Outpatient Medications:  .  albuterol (VENTOLIN HFA) 108 (90 Base) MCG/ACT inhaler, Inhale 2 puffs into the lungs every 6 (six) hours as needed for wheezing or shortness of breath., Disp: 18 g, Rfl: 2 .  Ferrous Sulfate (IRON) 325 (65 Fe) MG TABS, Take by mouth. 1 per day, Disp: , Rfl:  .  fluticasone furoate-vilanterol  (BREO ELLIPTA) 100-25 MCG/INH AEPB, Inhale 1 puff into the lungs daily., Disp: 4 each, Rfl: 6 .  losartan (COZAAR) 25 MG tablet, TAKE 1 TABLET BY MOUTH EVERY DAY, Disp: 30 tablet, Rfl: 2 .  Magnesium 250 MG TABS, Take by mouth. 1 per day, Disp: , Rfl:  .  PARoxetine (PAXIL) 10 MG tablet, Take 1 tablet (10 mg total) by mouth daily., Disp: 30 tablet, Rfl: 2 .  diclofenac sodium (VOLTAREN) 1 % GEL, Apply 2 g topically 4 (four) times daily. (Patient not taking: Reported on 11/30/2018), Disp: 50 g, Rfl: 2 .  estradiol (ESTRACE) 0.1 MG/GM vaginal cream, Place 1 Applicatorful vaginally daily as needed. When needed, Disp: , Rfl:    Allergies  Allergen Reactions  . Influenza Virus Vacc Split Pf Other (See Comments)    "deadly sick "  . Penicillins Hives, Itching and Swelling     Review of Systems  Constitutional: Negative.  Negative for malaise/fatigue.  Eyes: Negative for blurred vision.  Respiratory: Negative.  Negative for stridor.   Cardiovascular: Negative for chest pain, palpitations and leg swelling.  Neurological: Negative for dizziness and headaches.  Psychiatric/Behavioral: Negative for agitation. The patient is nervous/anxious.      Today's Vitals   11/30/18 1512  BP: (!) 150/104  Pulse: 84  Temp: 98.9  F (37.2 C)  TempSrc: Oral  Weight: 193 lb 3.2 oz (87.6 kg)  Height: 5' 5.2" (1.656 m)  PainSc: 0-No pain   Body mass index is 31.95 kg/m.   Objective:  Physical Exam Constitutional:      Appearance: Normal appearance.  Cardiovascular:     Rate and Rhythm: Normal rate and regular rhythm.     Pulses: Normal pulses.     Heart sounds: Normal heart sounds. No murmur.  Pulmonary:     Effort: Pulmonary effort is normal. No respiratory distress.     Breath sounds: Normal breath sounds.  Skin:    Capillary Refill: Capillary refill takes less than 2 seconds.  Neurological:     General: No focal deficit present.     Mental Status: She is alert and oriented to person, place,  and time.  Psychiatric:        Mood and Affect: Mood normal.        Behavior: Behavior normal.        Thought Content: Thought content normal.        Judgment: Judgment normal.         Assessment And Plan:   1. Essential hypertension  Continues to be elevated, I will change her to amlodipine daily   She has taken Bystolic in the past however she is having extreme hot flashes.  - amLODipine (NORVASC) 10 MG tablet; Take 1 tablet (10 mg total) by mouth daily.  Dispense: 30 tablet; Refill: 2  2. Hot flashes, menopausal  Will increase Paxil to 20 mg.  I will also refer her to GYN due to the severity of her hot flashes.  - Ambulatory referral to Gynecology - PARoxetine (PAXIL) 20 MG tablet; Take 1 tablet (20 mg total) by mouth daily.  Dispense: 30 tablet; Refill: 2  3. Mood changes  Will increase paxil to 20 mg as this is a low dose. - PARoxetine (PAXIL) 20 MG tablet; Take 1 tablet (20 mg total) by mouth daily.  Dispense: 30 tablet; Refill: 2   Minette Brine, FNP    THE PATIENT IS ENCOURAGED TO PRACTICE SOCIAL DISTANCING DUE TO THE COVID-19 PANDEMIC.

## 2018-11-30 NOTE — Patient Instructions (Addendum)
Menopause Menopause is the normal time of life when menstrual periods stop completely. It is usually confirmed by 12 months without a menstrual period. The transition to menopause (perimenopause) most often happens between the ages of 45 and 55. During perimenopause, hormone levels change in your body, which can cause symptoms and affect your health. Menopause may increase your risk for:  Loss of bone (osteoporosis), which causes bone breaks (fractures).  Depression.  Hardening and narrowing of the arteries (atherosclerosis), which can cause heart attacks and strokes. What are the causes? This condition is usually caused by a natural change in hormone levels that happens as you get older. The condition may also be caused by surgery to remove both ovaries (bilateral oophorectomy). What increases the risk? This condition is more likely to start at an earlier age if you have certain medical conditions or treatments, including:  A tumor of the pituitary gland in the brain.  A disease that affects the ovaries and hormone production.  Radiation treatment for cancer.  Certain cancer treatments, such as chemotherapy or hormone (anti-estrogen) therapy.  Heavy smoking and excessive alcohol use.  Family history of early menopause. This condition is also more likely to develop earlier in women who are very thin. What are the signs or symptoms? Symptoms of this condition include:  Hot flashes.  Irregular menstrual periods.  Night sweats.  Changes in feelings about sex. This could be a decrease in sex drive or an increased comfort around your sexuality.  Vaginal dryness and thinning of the vaginal walls. This may cause painful intercourse.  Dryness of the skin and development of wrinkles.  Headaches.  Problems sleeping (insomnia).  Mood swings or irritability.  Memory problems.  Weight gain.  Hair growth on the face and chest.  Bladder infections or problems with urinating. How  is this diagnosed? This condition is diagnosed based on your medical history, a physical exam, your age, your menstrual history, and your symptoms. Hormone tests may also be done. How is this treated? In some cases, no treatment is needed. You and your health care provider should make a decision together about whether treatment is necessary. Treatment will be based on your individual condition and preferences. Treatment for this condition focuses on managing symptoms. Treatment may include:  Menopausal hormone therapy (MHT).  Medicines to treat specific symptoms or complications.  Acupuncture.  Vitamin or herbal supplements. Before starting treatment, make sure to let your health care provider know if you have a personal or family history of:  Heart disease.  Breast cancer.  Blood clots.  Diabetes.  Osteoporosis. Follow these instructions at home: Lifestyle  Do not use any products that contain nicotine or tobacco, such as cigarettes and e-cigarettes. If you need help quitting, ask your health care provider.  Get at least 30 minutes of physical activity on 5 or more days each week.  Avoid alcoholic and caffeinated beverages, as well as spicy foods. This may help prevent hot flashes.  Get 7-8 hours of sleep each night.  If you have hot flashes, try: ? Dressing in layers. ? Avoiding things that may trigger hot flashes, such as spicy food, warm places, or stress. ? Taking slow, deep breaths when a hot flash starts. ? Keeping a fan in your home and office.  Find ways to manage stress, such as deep breathing, meditation, or journaling.  Consider going to group therapy with other women who are having menopause symptoms. Ask your health care provider about recommended group therapy meetings. Eating and   drinking  Eat a healthy, balanced diet that contains whole grains, lean protein, low-fat dairy, and plenty of fruits and vegetables.  Your health care provider may recommend  adding more soy to your diet. Foods that contain soy include tofu, tempeh, and soy milk.  Eat plenty of foods that contain calcium and vitamin D for bone health. Items that are rich in calcium include low-fat milk, yogurt, beans, almonds, sardines, broccoli, and kale. Medicines  Take over-the-counter and prescription medicines only as told by your health care provider.  Talk with your health care provider before starting any herbal supplements. If prescribed, take vitamins and supplements as told by your health care provider. These may include: ? Calcium. Women age 25 and older should get 1,200 mg (milligrams) of calcium every day. ? Vitamin D. Women need 600-800 International Units of vitamin D each day. ? Vitamins B12 and B6. Aim for 50 micrograms of B12 and 1.5 mg of B6 each day. General instructions  Keep track of your menstrual periods, including: ? When they occur. ? How heavy they are and how long they last. ? How much time passes between periods.  Keep track of your symptoms, noting when they start, how often you have them, and how long they last.  Use vaginal lubricants or moisturizers to help with vaginal dryness and improve comfort during sex.  Keep all follow-up visits as told by your health care provider. This is important. This includes any group therapy or counseling. Contact a health care provider if:  You are still having menstrual periods after age 24.  You have pain during sex.  You have not had a period for 12 months and you develop vaginal bleeding. Get help right away if:  You have: ? Severe depression. ? Excessive vaginal bleeding. ? Pain when you urinate. ? A fast or irregular heart beat (palpitations). ? Severe headaches. ? Abdomen (abdominal) pain or severe indigestion.  You fell and you think you have a broken bone.  You develop leg or chest pain.  You develop vision problems.  You feel a lump in your breast. Summary  Menopause is the normal  time of life when menstrual periods stop completely. It is usually confirmed by 12 months without a menstrual period.  The transition to menopause (perimenopause) most often happens between the ages of 17 and 30.  Symptoms can be managed through medicines, lifestyle changes, and complementary therapies such as acupuncture.  Eat a balanced diet that is rich in nutrients to promote bone health and heart health and to manage symptoms during menopause. This information is not intended to replace advice given to you by your health care provider. Make sure you discuss any questions you have with your health care provider. Document Released: 05/30/2003 Document Revised: 02/19/2017 Document Reviewed: 04/11/2016 Elsevier Patient Education  [place].    Take 1/2 tablet of amlodipine for 7 days then take whole tablet once a day  I have increased your Paxil to 20 mg daily. Take 2 of the 10 mgs daily until gone then take one tab 20 mg once a day.

## 2018-12-04 ENCOUNTER — Encounter: Payer: Self-pay | Admitting: Nurse Practitioner

## 2018-12-05 ENCOUNTER — Other Ambulatory Visit: Payer: Self-pay

## 2018-12-05 ENCOUNTER — Other Ambulatory Visit: Payer: Self-pay | Admitting: Nurse Practitioner

## 2018-12-05 DIAGNOSIS — J45909 Unspecified asthma, uncomplicated: Secondary | ICD-10-CM

## 2018-12-05 MED ORDER — BREO ELLIPTA 100-25 MCG/INH IN AEPB: 1.0000 | INHALATION_SPRAY | Freq: Every day | RESPIRATORY_TRACT | 3 refills | Status: DC

## 2018-12-05 MED ORDER — BREO ELLIPTA 100-25 MCG/INH IN AEPB: 1.0000 | INHALATION_SPRAY | Freq: Every day | RESPIRATORY_TRACT | 1 refills | Status: DC

## 2018-12-07 ENCOUNTER — Ambulatory Visit: Payer: BC Managed Care – PPO

## 2018-12-07 ENCOUNTER — Other Ambulatory Visit: Payer: Self-pay | Admitting: Nurse Practitioner

## 2018-12-07 ENCOUNTER — Other Ambulatory Visit: Payer: Self-pay

## 2018-12-07 VITALS — BP 158/86 | HR 84 | Temp 98.6°F | Ht 65.2 in | Wt 190.0 lb

## 2018-12-07 DIAGNOSIS — R030 Elevated blood-pressure reading, without diagnosis of hypertension: Secondary | ICD-10-CM

## 2018-12-07 DIAGNOSIS — J45909 Unspecified asthma, uncomplicated: Secondary | ICD-10-CM

## 2018-12-07 NOTE — Progress Notes (Signed)
Pt presented today for a blood pressure check 158/86 Janece aware

## 2018-12-09 ENCOUNTER — Ambulatory Visit: Payer: Self-pay

## 2018-12-09 DIAGNOSIS — I10 Essential (primary) hypertension: Secondary | ICD-10-CM

## 2018-12-09 DIAGNOSIS — J45909 Unspecified asthma, uncomplicated: Secondary | ICD-10-CM

## 2018-12-09 NOTE — Patient Instructions (Signed)
Visit Information   Ms. Sieker was given information about Care Management services today including:  1. Care Management services include personalized support from designated clinical staff supervised by her physician, including individualized plan of care and coordination with other care providers 2. 24/7 contact phone numbers for assistance for urgent and routine care needs. 3. The patient may stop CCM services at any time (effective at the end of the month) by phone call to the office staff.  Patient agreed to services and verbal consent obtained.   The patient verbalized understanding of instructions provided today and declined a print copy of patient instruction materials.   Telephone follow up appointment with care management team member scheduled for:9/22/2  Daneen Schick, BSW, CDP Social Worker, Certified Dementia Practitioner [place] [facility] [phone]

## 2018-12-09 NOTE — Chronic Care Management (AMB) (Signed)
  Care Management    12/09/2018 Name: Karen Molina MRN: [id number] DOB: April 26, 1960  Referred by: Minette Brine, FNP Reason for referral : [employer] is a 58 y.o. year old female who is a primary care patient of Minette Brine, [place]. The care management team was consulted for assistance with chronic disease management and care coordination needs.   Review of patient status, including review of consultants reports, relevant laboratory and other test results, and collaboration with appropriate care team members and the patient's provider was performed as part of comprehensive patient evaluation and provision of care management services.      [name] was given information about Care Management services today including:  1. Care Management services includes personalized support from designated clinical staff supervised by her physician, including individualized plan of care and coordination with other care providers 2. 24/7 contact phone numbers for assistance for urgent and routine care needs. 3. The patient may stop case management services at any time by phone call to the office staff.  Patient agreed to services and verbal consent obtained.    Follow up plan: Telephone follow up appointment with care management team member scheduled for:12/13/2018  Daneen Schick, BSW, CDP Social Worker, Certified Dementia Practitioner [place] [facility] [place] [facility] [phone]

## 2018-12-13 ENCOUNTER — Ambulatory Visit: Payer: Self-pay

## 2018-12-13 DIAGNOSIS — I10 Essential (primary) hypertension: Secondary | ICD-10-CM

## 2018-12-13 DIAGNOSIS — J45909 Unspecified asthma, uncomplicated: Secondary | ICD-10-CM

## 2018-12-14 NOTE — Chronic Care Management (AMB) (Signed)
  Chronic Care Management   Initial Visit Note  12/13/2018 Name: Karen Molina MRN: [id number] DOB: [birth year]  Referred by: Minette Brine, FNP Reason for referral : Care Coordination (CC RNCM Case Collaboration )   Karen Molina is a 58 y.o. year old female who is a primary care patient of Minette Brine, [place]. The care management team was consulted for assistance with chronic disease management and care coordination needs.   Review of patient status, including review of consultants reports, relevant laboratory and other test results, and collaboration with appropriate care team members and the patient's provider was performed as part of comprehensive patient evaluation and provision of chronic care management services.    I initiated and established the plan of care for Karen Molina during one on one collaboration with my clinical care management colleague Daneen Schick BSW who is also engaged with this patient to address social work needs.   Outpatient Encounter Medications as of 12/13/2018  Medication Sig  . albuterol (VENTOLIN HFA) 108 (90 Base) MCG/ACT inhaler Inhale 2 puffs into the lungs every 6 (six) hours as needed for wheezing or shortness of breath.  [name] amLODipine (NORVASC) 10 MG tablet Take 1 tablet (10 mg total) by mouth daily.  . diclofenac sodium (VOLTAREN) 1 % GEL Apply 2 g topically 4 (four) times daily. (Patient not taking: Reported on 11/30/2018)  . Ferrous Sulfate (IRON) 325 (65 Fe) MG TABS Take by mouth. 1 per day  . fluticasone furoate-vilanterol (BREO ELLIPTA) 100-25 MCG/INH AEPB Inhale 1 puff into the lungs daily.  [name] losartan (COZAAR) 25 MG tablet TAKE 1 TABLET BY MOUTH EVERY DAY  . Magnesium 250 MG TABS Take by mouth. 1 per day  . PARoxetine (PAXIL) 20 MG tablet Take 1 tablet (20 mg total) by mouth daily.   No facility-administered encounter medications on file as of 12/13/2018.      Goals Addressed    . Assist with Chronic Case Management and Care Coordination  needs       Current Barriers:  [name] Knowledge Barriers related to resources and support available to address needs related to Chronic Care Management and Care Coordination   Case Manager Clinical Goal(s):  [name] Over the next 30 days, patient will work with the CCM team to address needs related to Chronic Care Management and Community Resources  Interventions:  . Collaborated with BSW and initiated plan of care to address needs related to Chronic disease management and Care Coordination   Patient Self Care Activities:  . Self administers medications as prescribed . Attends all scheduled provider appointments . Calls provider office for new concerns or questions  Initial goal documentation         Telephone follow up appointment with care management team member scheduled for: 01/03/19  Barb Merino, RN, BSN, CCM Care Management Coordinator [place] [facility]  Direct Phone: [phone]

## 2018-12-14 NOTE — Patient Instructions (Signed)
Social Worker Visit Information  Goals we discussed today:  Goals Addressed            This Visit's Progress     Patient Stated   . "I can't afford to fix my home" (pt-stated)       Current Barriers:  . Financial constraints related to several months without employment within the last year . Lacks knowledge of community programs to assist with home repair . Limited resources available due to age  Clinical Social Work Clinical Goal(s):  [name] Over the next 60 days, patient will work with SW to address needs related to home repair  CCM SW Interventions: Completed 12/13/2018 . Patient interviewed and appropriate assessments performed . Determined the patient was out of work for several months in 2019. The patient is currently working at a stable job but having to catch up on a lot of owed debt . Discovered the patient's home currently has a leak in the basement when heavy rain occurs with some mold growing on the wall . Informed by the patient she is unable to fix this leak at this time due to limited finances. 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 Advised the patient SW would investigate resources available within the community in which she would qualify for  Patient Self Care Activities:  . Performs ADL's independently . Performs IADL's independently . Calls provider office for new concerns or questions  Initial goal documentation     . "I have a hard time affording food" (pt-stated)       Current Barriers:  . Financial constraints related to loss of job in 2019  [place]):  [name] Over the next 45 days, client will work with SW to address concerns related to food insecurity  CCM SW Interventions: Completed 12/13/2018 . Patient interviewed and appropriate assessments performed . Provided patient with information about local food pantry sites . Determined the patient currently accesses a food pantry at [facility] 6  months out of the year. Unfortunately, this site only allows 6 months of service, then the patient must wait 6 months to access again. The patient is currently in her 6 month waiting period . Assessed for current food insecurity. The patient reports she is careful to not live beyond her means and eats a lot of canned goods and beans . Advised the patient there are alternative food pantry sites she may access while unable to access the [employer] . Mailed the patient a list of local food pantry locations for her review . Scheduled follow up call to the patient to confirm receipt of resources and assess for ability to access   Patient Self Care Activities:  . Self administers medications as prescribed . Performs ADL's independently . Performs IADL's independently  Initial goal documentation     . "The Memory Dance is too expensive for me to buy every month" (pt-stated)       Current Barriers:  . Financial constraints related to loss of job in 2019  [place]):  [name] Over the next 30 days the patient will work with embedded PharmD to address concerns related to medication costs  CCM SW Interventions: Completed 12/13/2018 . Patient interviewed and appropriate assessments performed . Provided patient with information about the Chronic Care Management program . Advised the patient to expect a call from embedded PharmD Lottie Dawson over the coming weeks to assist with medication cost concerns . Collaboration with  Mrs Blanca Friend via in-basket message to inform of patient stated goal  Patient Self Care Activities:  . Self administers medications as prescribed . Attends all scheduled provider appointments . Calls provider office for new concerns or questions  Initial goal documentation       Other   . Assist with Chronic Case Management by assessing patients ability to independently manage chronic conditions       Current Barriers:  [name] Knowledge barriers related to  the patients ability to manage Chronic Conditions including HTN and Asthma  Clinical Social Work Clinical Goal(s):  [name] Over the next 30 days the patient will work with CCM RN Case Manager to establish an individualized care plan related to the management of patients identified chronic conditions  CCM SW Interventions: Completed 12/13/2018 . Patient interviewed and appropriate assessments performed . Assessed for the patients ability to verbalize current chronic medical conditions. The patient identifies current conditions as Asthma and high blood pressure . Determined the patient has difficulty managing her high blood pressure stating "I'm really trying to do right. I don't understand why it won't come down" . Informed by the patient she has not eaten pork the entire year, checks her blood pressure at home, and has attempted to add walking into her daily routine but becomes out of breath easily due to her asthma. "Mrs. Moore told me to take my rescue inhaler before I walk to see if that helps" . Encouraged the patient to rinse her canned food prior to cooking to reduce sodium intake . Advised the patient that RN Case Manager Glenard Haring Little would follow up with her in the coming weeks to address patient concerns surrounding her Asthma and High Blood Pressure . Scheduled follow up call on 01/03/2019 with RN Case Manager . Collaboration with RN Case Manager regarding patient enrollment and scheduled call  Patient Self Care Activities:  . Currently UNABLE TO independently manage chronic conditions  Initial goal documentation         Materials provided: Yes: mailed a food bank list   Follow up plan: SW will follow up with patient by phone over the next week.   Daneen Schick, BSW, CDP Social Worker, Certified Dementia Practitioner [place] [facility] [place] [facility] [phone]

## 2018-12-14 NOTE — Chronic Care Management (AMB) (Signed)
Care Management    Social Work General Note  12/13/2018 Name: Karen Molina MRN: [id number] DOB: 1960/05/17  Karen Molina is a 59 y.o. year old female who is a primary care patient of Karen Molina, Karen Molina. The CCM was consulted to assist the patient with [employer] .   Review of patient status, including review of consultants reports, relevant laboratory and other test results, and collaboration with appropriate care team members and the patient's provider was performed as part of comprehensive patient evaluation and provision of chronic care management services.    SDOH (Social Determinants of Health) screening performed today. See Care Plan Entry related to challenges with: Housing  Financial Strain  Food Insecurity    Outpatient Encounter Medications as of 12/13/2018  Medication Sig  . albuterol (VENTOLIN HFA) 108 (90 Base) MCG/ACT inhaler Inhale 2 puffs into the lungs every 6 (six) hours as needed for wheezing or shortness of breath.  [name] amLODipine (NORVASC) 10 MG tablet Take 1 tablet (10 mg total) by mouth daily.  . diclofenac sodium (VOLTAREN) 1 % GEL Apply 2 g topically 4 (four) times daily. (Patient not taking: Reported on 11/30/2018)  . Ferrous Sulfate (IRON) 325 (65 Fe) MG TABS Take by mouth. 1 per day  . fluticasone furoate-vilanterol (BREO ELLIPTA) 100-25 MCG/INH AEPB Inhale 1 puff into the lungs daily.  [name] losartan (COZAAR) 25 MG tablet TAKE 1 TABLET BY MOUTH EVERY DAY  . Magnesium 250 MG TABS Take by mouth. 1 per day  . PARoxetine (PAXIL) 20 MG tablet Take 1 tablet (20 mg total) by mouth daily.   No facility-administered encounter medications on file as of 12/13/2018.     Goals Addressed            This Visit's Progress     Patient Stated   . "I can't afford to fix my home" (pt-stated)       Current Barriers:  . Financial constraints related to several months without employment within the last year . Lacks knowledge of community programs to assist with home  repair . Limited resources available due to age  Clinical Social Work Clinical Goal(s):  [name] Over the next 60 days, patient will work with SW to address needs related to home repair  CCM SW Interventions: Completed 12/13/2018 . Patient interviewed and appropriate assessments performed . Determined the patient was out of work for several months in 2019. The patient is currently working at a stable job but having to catch up on a lot of owed debt . Discovered the patient's home currently has a leak in the basement when heavy rain occurs with some mold growing on the wall . Informed by the patient she is unable to fix this leak at this time due to limited finances. 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 Advised the patient SW would investigate resources available within the community in which she would qualify for  Patient Self Care Activities:  . Performs ADL's independently . Performs IADL's independently . Calls provider office for new concerns or questions  Initial goal documentation     . "I have a hard time affording food" (pt-stated)       Current Barriers:  . Financial constraints related to loss of job in 2019  [place]):  [name] Over the next 45 days, client will work with SW to address concerns related to food insecurity  CCM SW Interventions: Completed 12/13/2018 . Patient interviewed and appropriate assessments  performed . Provided patient with information about local food pantry sites . Determined the patient currently accesses a food pantry at [facility] 6 months out of the year. Unfortunately, this site only allows 6 months of service, then the patient must wait 6 months to access again. The patient is currently in her 6 month waiting period . Assessed for current food insecurity. The patient reports she is careful to not live beyond her means and eats a lot of canned goods and beans . Advised the patient there  are alternative food pantry sites she may access while unable to access the [employer] . Mailed the patient a list of local food pantry locations for her review . Scheduled follow up call to the patient to confirm receipt of resources and assess for ability to access   Patient Self Care Activities:  . Self administers medications as prescribed . Performs ADL's independently . Performs IADL's independently  Initial goal documentation     . "The Memory Dance is too expensive for me to buy every month" (pt-stated)       Current Barriers:  . Financial constraints related to loss of job in 2019  [place]):  [name] Over the next 30 days the patient will work with embedded PharmD to address concerns related to medication costs  CCM SW Interventions: Completed 12/13/2018 . Patient interviewed and appropriate assessments performed . Provided patient with information about the Chronic Care Management program . Advised the patient to expect a call from embedded PharmD Lottie Dawson over the coming weeks to assist with medication cost concerns . Collaboration with Mrs Blanca Friend via in-basket message to inform of patient stated goal  Patient Self Care Activities:  . Self administers medications as prescribed . Attends all scheduled provider appointments . Calls provider office for new concerns or questions  Initial goal documentation       Other   . Assist with Chronic Case Management by assessing patients ability to independently manage chronic conditions       Current Barriers:  [name] Knowledge barriers related to the patients ability to manage Chronic Conditions including HTN and Asthma  Clinical Social Work Clinical Goal(s):  [name] Over the next 30 days the patient will work with CCM RN Case Manager to establish an individualized care plan related to the management of patients identified chronic conditions  CCM SW Interventions: Completed 12/13/2018 . Patient  interviewed and appropriate assessments performed . Assessed for the patients ability to verbalize current chronic medical conditions. The patient identifies current conditions as Asthma and high blood pressure . Determined the patient has difficulty managing her high blood pressure stating "I'm really trying to do right. I don't understand why it won't come down" . Informed by the patient she has not eaten pork the entire year, checks her blood pressure at home, and has attempted to add walking into her daily routine but becomes out of breath easily due to her asthma. "Mrs. Moore told me to take my rescue inhaler before I walk to see if that helps" . Encouraged the patient to rinse her canned food prior to cooking to reduce sodium intake . Advised the patient that RN Case Manager Glenard Haring Little would follow up with her in the coming weeks to address patient concerns surrounding her Asthma and High Blood Pressure . Scheduled follow up call on 01/03/2019 with RN Case Manager . Collaboration with RN Case Manager regarding patient enrollment and scheduled call  Patient Self Care Activities:  .  Currently UNABLE TO independently manage chronic conditions  Initial goal documentation         Follow Up Plan: SW will follow up with patient by phone over the next week.       Daneen Schick, BSW, CDP Social Worker, Certified Dementia Practitioner [place] [facility] [place] [facility] [phone]

## 2018-12-15 ENCOUNTER — Ambulatory Visit: Payer: Self-pay

## 2018-12-15 DIAGNOSIS — I10 Essential (primary) hypertension: Secondary | ICD-10-CM

## 2018-12-15 DIAGNOSIS — J45909 Unspecified asthma, uncomplicated: Secondary | ICD-10-CM

## 2018-12-15 NOTE — Patient Instructions (Signed)
Visit Information  Goals Addressed            This Visit's Progress     Patient Stated   . "I can't afford to fix my home" (pt-stated)   On track    Current Barriers:  . Financial constraints related to several months without employment within the last year . Lacks knowledge of community programs to assist with home repair . Limited resources available due to age  Clinical Social Work Clinical Goal(s):  [name] Over the next 60 days, patient will work with SW to address needs related to home repair  CCM SW Interventions: Completed 12/15/2018 . Outbound call to the [place] Program to inquire if patients home repair need would qualify under program guidelines . Provided the patients name and address and was informed an application would be mailed to her home address . Outbound call to the patient to inform of resource information . Encouraged the patient to contact the Neighborhood Development program and/or SW upon receipt of application if assistance is needed to complete . Scheduled follow up call over the next 3 weeks to confirm receipt of application and assess progression of completion  Patient Self Care Activities:  . Performs ADL's independently . Performs IADL's independently . Calls provider office for new concerns or questions  Please see past updates related to this goal by clicking on the "Past Updates" button in the selected goal        SW will follow up with the patient over the next 3 weeks.  Daneen Schick, BSW, CDP Social Worker, Certified Dementia Practitioner [place] [facility] [place] [facility] [phone]

## 2018-12-15 NOTE — Chronic Care Management (AMB) (Signed)
   Care Management    Social Work Follow Up Note  12/15/2018 Name: Karen Molina MRN: [id number] DOB: 1960-12-02  Karen Molina is a 58 y.o. year old female who is a primary care patient of Minette Brine, [place]. The CCM team was consulted for assistance with [employer] .   Review of patient status, including review of consultants reports, other relevant assessments, and collaboration with appropriate care team members and the patient's provider was performed as part of comprehensive patient evaluation and provision of chronic care management services.     Outpatient Encounter Medications as of 12/15/2018  Medication Sig  . albuterol (VENTOLIN HFA) 108 (90 Base) MCG/ACT inhaler Inhale 2 puffs into the lungs every 6 (six) hours as needed for wheezing or shortness of breath.  [name] amLODipine (NORVASC) 10 MG tablet Take 1 tablet (10 mg total) by mouth daily.  . diclofenac sodium (VOLTAREN) 1 % GEL Apply 2 g topically 4 (four) times daily. (Patient not taking: Reported on 11/30/2018)  . Ferrous Sulfate (IRON) 325 (65 Fe) MG TABS Take by mouth. 1 per day  . fluticasone furoate-vilanterol (BREO ELLIPTA) 100-25 MCG/INH AEPB Inhale 1 puff into the lungs daily.  [name] losartan (COZAAR) 25 MG tablet TAKE 1 TABLET BY MOUTH EVERY DAY  . Magnesium 250 MG TABS Take by mouth. 1 per day  . PARoxetine (PAXIL) 20 MG tablet Take 1 tablet (20 mg total) by mouth daily.   No facility-administered encounter medications on file as of 12/15/2018.      Goals Addressed            This Visit's Progress     Patient Stated   . "I can't afford to fix my home" (pt-stated)   On track    Current Barriers:  . Financial constraints related to several months without employment within the last year . Lacks knowledge of community programs to assist with home repair . Limited resources available due to age  Clinical Social Work Clinical Goal(s):  [name] Over the next 60 days, patient will work with SW to address needs  related to home repair  CCM SW Interventions: Completed 12/15/2018 . Outbound call to the [place] Program to inquire if patients home repair need would qualify under program guidelines . Provided the patients name and address and was informed an application would be mailed to her home address . Outbound call to the patient to inform of resource information . Encouraged the patient to contact the Neighborhood Development program and/or SW upon receipt of application if assistance is needed to complete . Scheduled follow up call over the next 3 weeks to confirm receipt of application and assess progression of completion  Patient Self Care Activities:  . Performs ADL's independently . Performs IADL's independently . Calls provider office for new concerns or questions  Please see past updates related to this goal by clicking on the "Past Updates" button in the selected goal          Follow Up Plan: SW will follow up with patient by phone over the next 3 weeks.  Daneen Schick, BSW, CDP Social Worker, Certified Dementia Practitioner [place] [facility] [place] [facility] [phone]

## 2018-12-20 ENCOUNTER — Telehealth: Payer: Self-pay

## 2018-12-21 ENCOUNTER — Telehealth: Payer: Self-pay

## 2018-12-22 ENCOUNTER — Ambulatory Visit: Payer: Self-pay | Admitting: Pharmacist

## 2018-12-22 DIAGNOSIS — J45909 Unspecified asthma, uncomplicated: Secondary | ICD-10-CM

## 2018-12-27 NOTE — Progress Notes (Signed)
Chronic Care Management   Initial Visit Note  12/22/2018 Name: Karen Molina MRN: [id number] DOB: 1960-07-10  Referred by: Minette Brine, FNP Reason for referral : Chronic Care Management   Karen Molina is a 58 y.o. year old female who is a primary care patient of Minette Brine, [place]. The CCM team was consulted for assistance with chronic disease management and care coordination needs related to Asthma  Review of patient status, including review of consultants reports, relevant laboratory and other test results, and collaboration with appropriate care team members and the patient's provider was performed as part of comprehensive patient evaluation and provision of chronic care management services.    I spoke with Ms. Nicole by telephone today.  Advanced Directives Status: N See Care Plan and [facility] application for related entries.   Medications: Outpatient Encounter Medications as of 12/22/2018  Medication Sig  . albuterol (VENTOLIN HFA) 108 (90 Base) MCG/ACT inhaler Inhale 2 puffs into the lungs every 6 (six) hours as needed for wheezing or shortness of breath.  [name] amLODipine (NORVASC) 10 MG tablet Take 1 tablet (10 mg total) by mouth daily.  . diclofenac sodium (VOLTAREN) 1 % GEL Apply 2 g topically 4 (four) times daily. (Patient not taking: Reported on 11/30/2018)  . Ferrous Sulfate (IRON) 325 (65 Fe) MG TABS Take by mouth. 1 per day  . fluticasone furoate-vilanterol (BREO ELLIPTA) 100-25 MCG/INH AEPB Inhale 1 puff into the lungs daily.  [name] losartan (COZAAR) 25 MG tablet TAKE 1 TABLET BY MOUTH EVERY DAY  . Magnesium 250 MG TABS Take by mouth. 1 per day  . PARoxetine (PAXIL) 20 MG tablet Take 1 tablet (20 mg total) by mouth daily.   No facility-administered encounter medications on file as of 12/22/2018.      Objective:   Goals Addressed            This Visit's Progress     Patient Stated   . "The Memory Dance is too expensive for me to buy every month" (pt-stated)       Current  Barriers:  . Financial constraints related to loss of job in 2019  [place]):  [name] Over the next 30 days the patient will work with embedded PharmD to address concerns related to medication costs  CCM SW Interventions: Completed 12/13/2018 . Patient interviewed and appropriate assessments performed . Provided patient with information about the Chronic Care Management program . Advised the patient to expect a call from embedded PharmD Lottie Dawson over the coming weeks to assist with medication cost concerns . Collaboration with Mrs Blanca Friend via in-basket message to inform of patient stated goal   CCM PharmD Interventions:  Completed call to patient on 12/22/18 . Unfortunately, all inahlers for patient's asthma are Brand name and $30 copays.  No copay cards exist for LABA/ICS (Symbicort, Dulera, Breo, etc).  Will provide patient with [facility] inhaler equivalent to try and see if this will work the same as Adair Patter has.  If so, we can apply for [employer] Patient Assistance program.  Patient Self Care Activities:  . Self administers medications as prescribed . Attends all scheduled provider appointments . Calls provider office for new concerns or questions  Please see past updates related to this goal by clicking on the "Past Updates" button in the selected goal       Plan:   The care management team will reach out to the patient again over the next 4 weeks.  Regina Eck, PharmD,  BCPS Clinical Pharmacist, [place]: [phone]

## 2018-12-28 ENCOUNTER — Ambulatory Visit: Payer: BC Managed Care – PPO | Admitting: Nurse Practitioner

## 2018-12-28 ENCOUNTER — Encounter: Payer: Self-pay | Admitting: Nurse Practitioner

## 2018-12-28 ENCOUNTER — Ambulatory Visit: Payer: Self-pay | Admitting: Pharmacist

## 2018-12-28 ENCOUNTER — Other Ambulatory Visit: Payer: Self-pay

## 2018-12-28 VITALS — BP 140/88 | HR 80 | Temp 98.9°F | Ht 64.8 in | Wt 193.6 lb

## 2018-12-28 DIAGNOSIS — J45909 Unspecified asthma, uncomplicated: Secondary | ICD-10-CM | POA: Diagnosis not present

## 2018-12-28 DIAGNOSIS — R5383 Other fatigue: Secondary | ICD-10-CM

## 2018-12-28 DIAGNOSIS — I10 Essential (primary) hypertension: Secondary | ICD-10-CM | POA: Diagnosis not present

## 2018-12-28 DIAGNOSIS — Z1231 Encounter for screening mammogram for malignant neoplasm of breast: Secondary | ICD-10-CM

## 2018-12-28 DIAGNOSIS — M25511 Pain in right shoulder: Secondary | ICD-10-CM

## 2018-12-28 MED ORDER — DICLOFENAC SODIUM 1 % TD GEL: 2.0000 g | Freq: Four times a day (QID) | TRANSDERMAL | 2 refills | Status: DC

## 2018-12-28 MED ORDER — TRAMADOL HCL 50 MG PO TABS: 50.0000 mg | ORAL_TABLET | Freq: Four times a day (QID) | ORAL | 0 refills | Status: DC | PRN

## 2018-12-28 NOTE — Progress Notes (Signed)
Subjective:     Patient ID: Karen Molina , female    DOB: December 04, 1960 , 58 y.o.   MRN: [id number]   Chief Complaint  Patient presents with  . Hypertension    HPI  She is here today for medication follow up - taking amlodipine.  She has drank 1 bottle of water today.  She is only exercise 1-2 days per week.    Hypertension This is a chronic problem. The problem is uncontrolled. Pertinent negatives include no anxiety, chest pain, headaches or shortness of breath. There are no associated agents to hypertension. Risk factors for coronary artery disease include obesity and sedentary lifestyle. Past treatments include calcium channel blockers. There are no compliance problems.  There is no history of angina or kidney disease. There is no history of chronic renal disease.  Shoulder Pain  This is a new problem. The current episode started 1 to 4 weeks ago. The problem occurs intermittently. The problem has been gradually worsening. The quality of the pain is described as aching. Pertinent negatives include no fever or itching. The symptoms are aggravated by activity. She has tried acetaminophen and NSAIDS for the symptoms. Family history does not include gout or rheumatoid arthritis. There is no history of osteoarthritis.      Past Medical History:  Diagnosis Date  . Asthma   . Cervical cancer ([place])   . Chest pain   . Fatigue   . H/O: hysterectomy   . Insomnia   . Palpitations   . Paresthesias    lower extremities     Family History  Problem Relation Age of Onset  . Hypertension Mother   . Diabetes Mother   . Osteoarthritis Mother   . Stroke Maternal Grandmother   . Diabetes Maternal Grandmother   . Diabetes Brother   . Hypertension Brother   . HIV/AIDS Brother   . Drug abuse Brother   . Lupus Sister   . Arthritis Sister   . Breast cancer Daughter   . Asthma Father   . Healthy Brother   . Healthy Brother   . Healthy Son   . Cancer Paternal Grandfather      Current  Outpatient Medications:  .  albuterol (VENTOLIN HFA) 108 (90 Base) MCG/ACT inhaler, Inhale 2 puffs into the lungs every 6 (six) hours as needed for wheezing or shortness of breath., Disp: 18 g, Rfl: 2 .  amLODipine (NORVASC) 10 MG tablet, Take 1 tablet (10 mg total) by mouth daily., Disp: 30 tablet, Rfl: 2 .  Ferrous Sulfate (IRON) 325 (65 Fe) MG TABS, Take by mouth. 1 per day, Disp: , Rfl:  .  fluticasone furoate-vilanterol (BREO ELLIPTA) 100-25 MCG/INH AEPB, Inhale 1 puff into the lungs daily., Disp: 28 each, Rfl: 3 .  Magnesium 250 MG TABS, Take by mouth. 1 per day, Disp: , Rfl:  .  PARoxetine (PAXIL) 20 MG tablet, Take 1 tablet (20 mg total) by mouth daily., Disp: 30 tablet, Rfl: 2   Allergies  Allergen Reactions  . Influenza Virus Vacc Split Pf Other (See Comments)    "deadly sick "  . Penicillins Hives, Itching and Swelling     Review of Systems  Constitutional: Negative.  Negative for fatigue and fever.  Eyes: Negative for visual disturbance.  Respiratory: Negative.  Negative for shortness of breath.   Cardiovascular: Negative.  Negative for chest pain.  Gastrointestinal: Negative.   Endocrine: Negative.  Negative for polydipsia, polyphagia and polyuria.  Musculoskeletal: Negative.   Skin: Negative.  Negative for itching.  Neurological: Negative for dizziness, weakness and headaches.  Psychiatric/Behavioral: Negative.  Negative for confusion. The patient is not nervous/anxious.      Today's Vitals   12/28/18 1436  BP: 140/88  Pulse: 80  Temp: 98.9 F (37.2 C)  TempSrc: Oral  Weight: 193 lb 9.6 oz (87.8 kg)  Height: 5' 4.8" (1.646 m)  PainSc: 2   PainLoc: Shoulder   Body mass index is 32.42 kg/m.   Objective:  Physical Exam Vitals signs reviewed.  Constitutional:      General: She is not in acute distress.    Appearance: Normal appearance. She is obese.  Cardiovascular:     Rate and Rhythm: Normal rate and regular rhythm.     Pulses: Normal pulses.     Heart  sounds: Normal heart sounds. No murmur.  Pulmonary:     Effort: Pulmonary effort is normal. No respiratory distress.     Breath sounds: Normal breath sounds.  Skin:    Capillary Refill: Capillary refill takes less than 2 seconds.  Neurological:     General: No focal deficit present.     Mental Status: She is alert and oriented to person, place, and time.  Psychiatric:        Mood and Affect: Mood normal.        Behavior: Behavior normal.        Thought Content: Thought content normal.        Judgment: Judgment normal.         Assessment And Plan:     1. Fatigue, unspecified type  This is persistent and she feels is related to her iron, she is taking over the counter iron 65mg  daily and continues to have fatigue  Will check metabolic cause  Continue using your CPAP regularly - Vitamin B12 - Iron, TIBC and Ferritin Panel  2. Essential hypertension  Chronic, fair control  Tolerating amlodipine well  3. Acute pain of right shoulder  Tenderness to right shoulder anteriorly  This is a reoccurring issue with limited range of motion with flexion  She has tried pain creams and ibuprofen which have been unresponsive, due to this I will refer her to orthopedics and obtain a shoulder xray  She was also given a limited supply of tramadol after checking PDMP, no recent medication fills. - DG Shoulder Right; Future - Ambulatory referral to Orthopedic Surgery - MM Digital Diagnostic Bilat; Future  4. Extrinsic asthma, unspecified asthma severity, uncomplicated  No current issues, she was given samples of Dulera due to the cost of Breo by Almyra Free CCM Pharmacist  Minette Brine, FNP    THE PATIENT IS ENCOURAGED TO PRACTICE SOCIAL DISTANCING DUE TO THE COVID-19 PANDEMIC.

## 2019-01-02 ENCOUNTER — Telehealth: Payer: Self-pay

## 2019-01-02 NOTE — Progress Notes (Signed)
Chronic Care Management   Visit Note  12/28/2018 Name: Karen Molina MRN: [id number] DOB: 08-29-1960  Referred by: Minette Brine, FNP Reason for referral : Chronic Care Management and Asthma   Karen Molina is a 58 y.o. year old female who is a primary care patient of Minette Brine, [place]. The CCM team was consulted for assistance with chronic disease management and care coordination needs related to Asthma  Review of patient status, including review of consultants reports, relevant laboratory and other test results, and collaboration with appropriate care team members and the patient's provider was performed as part of comprehensive patient evaluation and provision of chronic care management services.    I met with Karen Molina in clinic today.  Advanced Directives Status: N See Care Plan and [facility] application for related entries.   Medications: Outpatient Encounter Medications as of 12/28/2018  Medication Sig  . albuterol (VENTOLIN HFA) 108 (90 Base) MCG/ACT inhaler Inhale 2 puffs into the lungs every 6 (six) hours as needed for wheezing or shortness of breath.  [name] amLODipine (NORVASC) 10 MG tablet Take 1 tablet (10 mg total) by mouth daily.  . diclofenac sodium (VOLTAREN) 1 % GEL Apply 2 g topically 4 (four) times daily.  . Ferrous Sulfate (IRON) 325 (65 Fe) MG TABS Take by mouth. 1 per day  . fluticasone furoate-vilanterol (BREO ELLIPTA) 100-25 MCG/INH AEPB Inhale 1 puff into the lungs daily.  . Magnesium 250 MG TABS Take by mouth. 1 per day  . PARoxetine (PAXIL) 20 MG tablet Take 1 tablet (20 mg total) by mouth daily.  . traMADol (ULTRAM) 50 MG tablet Take 1 tablet (50 mg total) by mouth every 6 (six) hours as needed.   No facility-administered encounter medications on file as of 12/28/2018.      Objective:   Goals Addressed            This Visit's Progress     Patient Stated   . "The Memory Dance is too expensive for me to buy every month" (pt-stated)       Current Barriers:  .  Financial constraints related to loss of job in 2019  [place]):  [name] Over the next 30 days the patient will work with embedded PharmD to address concerns related to medication costs  CCM SW Interventions: Completed 12/13/2018 . Patient interviewed and appropriate assessments performed . Provided patient with information about the Chronic Care Management program . Advised the patient to expect a call from embedded PharmD Lottie Dawson over the coming weeks to assist with medication cost concerns . Collaboration with Karen Molina via in-basket message to inform of patient stated goal   CCM PharmD Interventions:  Completed clinic visit with patient on 12/28/18 . Unfortunately, all inahlers for patient's asthma are Brand name and $30 copays.  No copay cards exist for LABA/ICS (Symbicort, Dulera, Breo, etc).  Will provide patient with [facility] inhaler equivalent to try and see if this will work the same as Adair Patter has.  If so, we can apply for [employer] Patient Assistance program. . Patient has 2 more months of Breo Ellipta remaining.  Encouraged patient to continuing using Breo as prescribed.  She reports this is controlling her asthma . Patient requesting Pulmonology referral-->message relayed to PCP . Dulera sample provided for patient to try once Memory Dance is completed.    Patient Self Care Activities:  . Self administers medications as prescribed . Attends all scheduled provider appointments . Calls provider office for new concerns  or questions  Please see past updates related to this goal by clicking on the "Past Updates" button in the selected goal          Plan:   The care management team will reach out to the patient again over the next 2 months.  Regina Eck, PharmD, BCPS Clinical Pharmacist, [facility] [place]: [phone]

## 2019-01-02 NOTE — Telephone Encounter (Signed)
Pt informed to get diclofenac gel over the counter. Pt agreed

## 2019-01-03 ENCOUNTER — Telehealth: Payer: Self-pay

## 2019-01-05 ENCOUNTER — Telehealth: Payer: Self-pay

## 2019-01-05 ENCOUNTER — Ambulatory Visit: Payer: Self-pay

## 2019-01-05 DIAGNOSIS — J45909 Unspecified asthma, uncomplicated: Secondary | ICD-10-CM

## 2019-01-05 DIAGNOSIS — I10 Essential (primary) hypertension: Secondary | ICD-10-CM

## 2019-01-05 NOTE — Chronic Care Management (AMB) (Signed)
  Care Management   Follow Up Note   01/05/2019 Name: Karen Molina MRN: [id number] DOB: 1960/07/29  Referred by: Minette Brine, FNP Reason for referral : Care Coordination   SW placed an unsuccessful outbound call to the patient to assess progression of SW goal. Automated message stating "welcome to [place]. The person you are trying to call is temporarily unavailable".   The care management team will reach out to the patient again over the next 15 days.   Daneen Schick, BSW, CDP Social Worker, Certified Dementia Practitioner [place] / [place] Management [phone]

## 2019-01-12 ENCOUNTER — Ambulatory Visit: Payer: Self-pay

## 2019-01-12 DIAGNOSIS — I10 Essential (primary) hypertension: Secondary | ICD-10-CM

## 2019-01-12 DIAGNOSIS — J45909 Unspecified asthma, uncomplicated: Secondary | ICD-10-CM

## 2019-01-12 NOTE — Chronic Care Management (AMB) (Signed)
Care Management   Social Work Follow Up Note  01/12/2019 Name: Karen Molina MRN: [id number] DOB: September 05, 1960  [name] is a 58 y.o. year old female who is a primary care patient of Minette Brine, [place]. The CCM team was consulted for assistance with [employer] .   Review of patient status, including review of consultants reports, other relevant assessments, and collaboration with appropriate care team members and the patient's provider was performed as part of comprehensive patient evaluation and provision of chronic care management services.    SDOH (Social Determinants of Health) screening performed today: Contractor . See Care Plan for related entries.    Outpatient Encounter Medications as of 01/12/2019  Medication Sig  . albuterol (VENTOLIN HFA) 108 (90 Base) MCG/ACT inhaler Inhale 2 puffs into the lungs every 6 (six) hours as needed for wheezing or shortness of breath.  [name] amLODipine (NORVASC) 10 MG tablet Take 1 tablet (10 mg total) by mouth daily.  . diclofenac sodium (VOLTAREN) 1 % GEL Apply 2 g topically 4 (four) times daily.  . Ferrous Sulfate (IRON) 325 (65 Fe) MG TABS Take by mouth. 1 per day  . fluticasone furoate-vilanterol (BREO ELLIPTA) 100-25 MCG/INH AEPB Inhale 1 puff into the lungs daily.  . Magnesium 250 MG TABS Take by mouth. 1 per day  . PARoxetine (PAXIL) 20 MG tablet Take 1 tablet (20 mg total) by mouth daily.  . traMADol (ULTRAM) 50 MG tablet Take 1 tablet (50 mg total) by mouth every 6 (six) hours as needed.   No facility-administered encounter medications on file as of 01/12/2019.      Goals Addressed            This Visit's Progress     Patient Stated   . "I can't afford to fix my home" (pt-stated)   On track    Current Barriers:  . Financial constraints related to several months without employment within the last year . Lacks knowledge of community programs to assist with home repair . Limited  resources available due to age  Clinical Social Work Clinical Goal(s):  [name] Over the next 60 days, patient will work with SW to address needs related to home repair  CCM SW Interventions: Completed 01/12/2019 . Outbound call to the patient to assess progression of patient goal . Determined the patient has received application for Neighborhood Development and is in process of gathering needed paperwork o "I am waiting on the declaration page from my insurance and then I will submit my application" . Encouraged the patient to continue working hard to accomplish this goal  . Advised the patient to contact SW if needed prior to next scheduled call  Patient Self Care Activities:  . Performs ADL's independently . Performs IADL's independently . Calls provider office for new concerns or questions  Please see past updates related to this goal by clicking on the "Past Updates" button in the selected goal      . "I have a hard time affording food" (pt-stated)       Current Barriers:  . Financial constraints related to loss of job in 2019  [place]):  [name] Over the next 45 days, client will work with SW to address concerns related to food insecurity  CCM SW Interventions: Completed 01/12/2019 . Outbound call to the patient to assess progression of patient stated goal . Confirmed receipt of mailed resources to assist with food insecurity . Assessed  for patient ability to access food pantry resources- patient has no concerns during today's call . Goal Met  Patient Self Care Activities:  . Self administers medications as prescribed . Performs ADL's independently . Performs IADL's independently  Please see past updates related to this goal by clicking on the "Past Updates" button in the selected goal          Follow Up Plan: SW will follow up with patient by phone over the next month to assess status of home repair application   Daneen Schick, BSW, CDP Social  Worker, Certified Dementia Practitioner [place] / [place] Management [phone]

## 2019-01-12 NOTE — Patient Instructions (Signed)
Visit Information  Goals Addressed            This Visit's Progress     Patient Stated   . "I can't afford to fix my home" (pt-stated)   On track    Current Barriers:  . Financial constraints related to several months without employment within the last year . Lacks knowledge of community programs to assist with home repair . Limited resources available due to age  Clinical Social Work Clinical Goal(s):  [name] Over the next 60 days, patient will work with SW to address needs related to home repair  CCM SW Interventions: Completed 01/12/2019 . Outbound call to the patient to assess progression of patient goal . Determined the patient has received application for Neighborhood Development and is in process of gathering needed paperwork o "I am waiting on the declaration page from my insurance and then I will submit my application" . Encouraged the patient to continue working hard to accomplish this goal  . Advised the patient to contact SW if needed prior to next scheduled call  Patient Self Care Activities:  . Performs ADL's independently . Performs IADL's independently . Calls provider office for new concerns or questions  Please see past updates related to this goal by clicking on the "Past Updates" button in the selected goal      . "I have a hard time affording food" (pt-stated)       Current Barriers:  . Financial constraints related to loss of job in 2019  [place]):  [name] Over the next 45 days, client will work with SW to address concerns related to food insecurity  CCM SW Interventions: Completed 01/12/2019 . Outbound call to the patient to assess progression of patient stated goal . Confirmed receipt of mailed resources to assist with food insecurity . Assessed for patient ability to access food pantry resources- patient has no concerns during today's call . Goal Met  Patient Self Care Activities:  . Self administers medications as  prescribed . Performs ADL's independently . Performs IADL's independently  Please see past updates related to this goal by clicking on the "Past Updates" button in the selected goal         SW will follow up with the patient over the next month.  Daneen Schick, BSW, CDP Social Worker, Certified Dementia Practitioner [place] / [place] Management [phone]

## 2019-01-19 ENCOUNTER — Telehealth: Payer: Self-pay

## 2019-01-24 ENCOUNTER — Other Ambulatory Visit: Payer: Self-pay | Admitting: Obstetrics and Gynecology

## 2019-02-08 ENCOUNTER — Telehealth: Payer: Self-pay

## 2019-02-09 ENCOUNTER — Ambulatory Visit: Payer: Self-pay

## 2019-02-09 ENCOUNTER — Telehealth: Payer: Self-pay

## 2019-02-09 DIAGNOSIS — I10 Essential (primary) hypertension: Secondary | ICD-10-CM

## 2019-02-09 DIAGNOSIS — J45909 Unspecified asthma, uncomplicated: Secondary | ICD-10-CM

## 2019-02-09 NOTE — Chronic Care Management (AMB) (Signed)
   Care Management   Outreach Note  02/09/2019 Name: Karen Molina MRN: [id number] DOB: 1960-12-21  Referred by: Minette Brine, FNP Reason for referral : Care Coordination   Sw placed an unsuccessful outbound call to the patient to assess progression of patient housing goal as well as to assist with care coordination. SW left a HIPAA compliant voice message requesting a return call.  Follow Up Plan: The care management team will reach out to the patient again over the next 21 days.   Daneen Schick, BSW, CDP Social Worker, Certified Dementia Practitioner [place] / [place] Management [phone]

## 2019-02-09 NOTE — Progress Notes (Signed)
This encounter was created in error - please disregard.

## 2019-02-13 ENCOUNTER — Ambulatory Visit: Payer: Self-pay

## 2019-02-13 DIAGNOSIS — I10 Essential (primary) hypertension: Secondary | ICD-10-CM

## 2019-02-13 DIAGNOSIS — J45909 Unspecified asthma, uncomplicated: Secondary | ICD-10-CM

## 2019-02-13 NOTE — Chronic Care Management (AMB) (Signed)
  Care Management   Social Work Follow Up Note  02/13/2019 Name: Karen Molina MRN: [id number] DOB: October 09, 1960  Karen Molina is a 58 y.o. year old female who is a primary care patient of Minette Brine, [place]. The CCM team was consulted for assistance with care coordination.   Review of patient status, including review of consultants reports, other relevant assessments, and collaboration with appropriate care team members and the patient's provider was performed as part of comprehensive patient evaluation and provision of chronic care management services.    SW received a voice message from the patient updating on progression of housing repair goal.  Outpatient Encounter Medications as of 02/13/2019  Medication Sig  . albuterol (VENTOLIN HFA) 108 (90 Base) MCG/ACT inhaler Inhale 2 puffs into the lungs every 6 (six) hours as needed for wheezing or shortness of breath.  [name] amLODipine (NORVASC) 10 MG tablet Take 1 tablet (10 mg total) by mouth daily.  . diclofenac sodium (VOLTAREN) 1 % GEL Apply 2 g topically 4 (four) times daily.  . Ferrous Sulfate (IRON) 325 (65 Fe) MG TABS Take by mouth. 1 per day  . fluticasone furoate-vilanterol (BREO ELLIPTA) 100-25 MCG/INH AEPB Inhale 1 puff into the lungs daily.  . Magnesium 250 MG TABS Take by mouth. 1 per day  . PARoxetine (PAXIL) 20 MG tablet Take 1 tablet (20 mg total) by mouth daily.  . traMADol (ULTRAM) 50 MG tablet Take 1 tablet (50 mg total) by mouth every 6 (six) hours as needed.   No facility-administered encounter medications on file as of 02/13/2019.      Goals Addressed            This Visit's Progress     Patient Stated   . "I can't afford to fix my home" (pt-stated)       Current Barriers:  . Financial constraints related to several months without employment within the last year . Lacks knowledge of community programs to assist with home repair . Limited resources available due to age  Clinical Social Work Clinical Goal(s):   [name] Over the next 60 days, patient will work with SW to address needs related to home repair  CCM SW Interventions: Completed 02/13/2019 . SW received voice message from the patient reporting submission of application to the [place] . Scheduled follow up call over the next month to assess progression of patient goal  Patient Self Care Activities:  . Performs ADL's independently . Performs IADL's independently . Calls provider office for new concerns or questions  Please see past updates related to this goal by clicking on the "Past Updates" button in the selected goal          Follow Up Plan: SW will follow up with the patient over the next month.   Daneen Schick, BSW, CDP Social Worker, Certified Dementia Practitioner [place] / [place] Management [phone]

## 2019-02-20 ENCOUNTER — Telehealth: Payer: Self-pay

## 2019-02-27 ENCOUNTER — Ambulatory Visit: Payer: BC Managed Care – PPO | Admitting: Nurse Practitioner

## 2019-02-27 ENCOUNTER — Encounter: Payer: Self-pay | Admitting: Nurse Practitioner

## 2019-02-27 ENCOUNTER — Other Ambulatory Visit: Payer: Self-pay

## 2019-02-27 ENCOUNTER — Telehealth: Payer: Self-pay

## 2019-02-27 VITALS — BP 134/80 | HR 98 | Temp 98.8°F | Ht 64.8 in | Wt 193.4 lb

## 2019-02-27 DIAGNOSIS — R5383 Other fatigue: Secondary | ICD-10-CM | POA: Diagnosis not present

## 2019-02-27 DIAGNOSIS — G8929 Other chronic pain: Secondary | ICD-10-CM | POA: Diagnosis not present

## 2019-02-27 DIAGNOSIS — I10 Essential (primary) hypertension: Secondary | ICD-10-CM

## 2019-02-27 DIAGNOSIS — Z9989 Dependence on other enabling machines and devices: Secondary | ICD-10-CM

## 2019-02-27 DIAGNOSIS — G4733 Obstructive sleep apnea (adult) (pediatric): Secondary | ICD-10-CM

## 2019-02-27 DIAGNOSIS — M25511 Pain in right shoulder: Secondary | ICD-10-CM

## 2019-02-27 NOTE — Progress Notes (Addendum)
Subjective:     Patient ID: Karen Molina , female    DOB: 1960/04/09 , 58 y.o.   MRN: [id number]   Chief Complaint  Patient presents with  . Hypertension    HPI  Karen Molina is here today for medication follow up - taking amlodipine.  Karen Molina continues to struggle   Hypertension This is a chronic problem. The problem is controlled. Pertinent negatives include no anxiety, chest pain, headaches or shortness of breath. There are no associated agents to hypertension. Risk factors for coronary artery disease include obesity and sedentary lifestyle. Past treatments include calcium channel blockers. There are no compliance problems.  There is no history of angina or kidney disease. There is no history of chronic renal disease.  Shoulder Pain  This is a new problem. The current episode started 1 to 4 weeks ago. The problem occurs intermittently. The problem has been gradually worsening. The quality of the pain is described as aching. Pertinent negatives include no fever or itching. The symptoms are aggravated by activity. Karen Molina has tried acetaminophen and NSAIDS for the symptoms. Family history does not include gout or rheumatoid arthritis. There is no history of osteoarthritis.      Past Medical History:  Diagnosis Date  . Asthma   . Cervical cancer ([place])   . Chest pain   . Fatigue   . H/O: hysterectomy   . Insomnia   . Palpitations   . Paresthesias    lower extremities     Family History  Problem Relation Age of Onset  . Hypertension Mother   . Diabetes Mother   . Osteoarthritis Mother   . Stroke Maternal Grandmother   . Diabetes Maternal Grandmother   . Diabetes Brother   . Hypertension Brother   . HIV/AIDS Brother   . Drug abuse Brother   . Lupus Sister   . Arthritis Sister   . Breast cancer Daughter   . Asthma Father   . Healthy Brother   . Healthy Brother   . Healthy Son   . Cancer Paternal Grandfather      Current Outpatient Medications:  .  albuterol (VENTOLIN HFA) 108  (90 Base) MCG/ACT inhaler, Inhale 2 puffs into the lungs every 6 (six) hours as needed for wheezing or shortness of breath., Disp: 18 g, Rfl: 2 .  amLODipine (NORVASC) 10 MG tablet, Take 1 tablet (10 mg total) by mouth daily., Disp: 30 tablet, Rfl: 2 .  Ferrous Sulfate (IRON) 325 (65 Fe) MG TABS, Take by mouth. 1 per day, Disp: , Rfl:  .  Magnesium 250 MG TABS, Take by mouth. 1 per day, Disp: , Rfl:  .  mometasone-formoterol (DULERA) 100-5 MCG/ACT AERO, Inhale 2 puffs into the lungs 2 (two) times daily., Disp: , Rfl:  .  PARoxetine (PAXIL) 20 MG tablet, Take 1 tablet (20 mg total) by mouth daily., Disp: 30 tablet, Rfl: 2   Allergies  Allergen Reactions  . Influenza Virus Vacc Split Pf Other (See Comments)    "deadly sick "  . Penicillins Hives, Itching and Swelling     Review of Systems  Constitutional: Negative.  Negative for fatigue and fever.  Eyes: Negative for visual disturbance.  Respiratory: Negative.  Negative for shortness of breath.   Cardiovascular: Negative.  Negative for chest pain.  Gastrointestinal: Negative.   Endocrine: Negative.  Negative for polydipsia, polyphagia and polyuria.  Musculoskeletal:       Shoulder pain   Skin: Negative.  Negative for itching.  Neurological: Negative for  dizziness, weakness and headaches.  Psychiatric/Behavioral: Negative.  Negative for confusion. The patient is not nervous/anxious.      Today's Vitals   02/27/19 1426  BP: 134/80  Pulse: 98  Temp: 98.8 F (37.1 C)  TempSrc: Oral  Weight: 193 lb 6.4 oz (87.7 kg)  Height: 5' 4.8" (1.646 m)  PainSc: 0-No pain   Body mass index is 32.38 kg/m.   Objective:  Physical Exam Vitals reviewed.  Constitutional:      General: Karen Molina is not in acute distress.    Appearance: Normal appearance. Karen Molina is obese.  Cardiovascular:     Rate and Rhythm: Normal rate and regular rhythm.     Pulses: Normal pulses.     Heart sounds: Normal heart sounds. No murmur.  Pulmonary:     Effort: Pulmonary  effort is normal. No respiratory distress.     Breath sounds: Normal breath sounds.  Musculoskeletal:        General: No swelling, tenderness or signs of injury. Normal range of motion.  Skin:    Capillary Refill: Capillary refill takes less than 2 seconds.  Neurological:     General: No focal deficit present.     Mental Status: Karen Molina is alert and oriented to person, place, and time.  Psychiatric:        Mood and Affect: Mood normal.        Behavior: Behavior normal.        Thought Content: Thought content normal.        Judgment: Judgment normal.         Assessment And Plan:     1. Essential hypertension  Chronic, better controlled  Tolerating amlodipine well, no swelling of lower extremities  2. Fatigue, unspecified type  Will redraw labs as Karen Molina did not stop at last visit pending results will determine type of iron Karen Molina should take - Hemoglobin A1c - Vitamin B12 - Iron, TIBC and Ferritin Panel  3. OSA on CPAP  Admits to needing to do better with wearing her CPAP nightly  I discussed the importance to wear nightly can help with fatigue  4. Chronic right shoulder pain  Karen Molina is not interested in any medications stronger than tylenol  Will continue to monitor if worsens Karen Molina may need an xray or referral to orthopedics   Minette Brine, FNP    THE PATIENT IS ENCOURAGED TO PRACTICE SOCIAL DISTANCING DUE TO THE COVID-19 PANDEMIC.

## 2019-02-28 LAB — IRON,TIBC AND FERRITIN PANEL
Ferritin: 200 ng/mL — ABNORMAL HIGH (ref 15–150)
Iron Saturation: 17 % (ref 15–55)
Iron: 60 ug/dL (ref 27–159)
Total Iron Binding Capacity: 351 ug/dL (ref 250–450)
UIBC: 291 ug/dL (ref 131–425)

## 2019-02-28 LAB — HEMOGLOBIN A1C
Est. average glucose Bld gHb Est-mCnc: 134 mg/dL
Hgb A1c MFr Bld: 6.3 % — ABNORMAL HIGH (ref 4.8–5.6)

## 2019-02-28 LAB — VITAMIN B12: Vitamin B-12: 707 pg/mL (ref 232–1245)

## 2019-03-01 ENCOUNTER — Telehealth: Payer: Self-pay

## 2019-03-07 ENCOUNTER — Ambulatory Visit
Admission: RE | Admit: 2019-03-07 | Discharge: 2019-03-07 | Disposition: A | Discharge status: Home / Self Care | Payer: BC Managed Care – PPO | Source: Ambulatory Visit | Attending: Nurse Practitioner | Admitting: Nurse Practitioner

## 2019-03-07 ENCOUNTER — Other Ambulatory Visit: Payer: Self-pay

## 2019-03-07 DIAGNOSIS — Z1231 Encounter for screening mammogram for malignant neoplasm of breast: Secondary | ICD-10-CM

## 2019-03-13 ENCOUNTER — Other Ambulatory Visit: Payer: Self-pay | Admitting: Nurse Practitioner

## 2019-03-13 DIAGNOSIS — N951 Menopausal and female climacteric states: Secondary | ICD-10-CM

## 2019-03-13 DIAGNOSIS — R4586 Emotional lability: Secondary | ICD-10-CM

## 2019-03-13 DIAGNOSIS — I10 Essential (primary) hypertension: Secondary | ICD-10-CM

## 2019-03-15 ENCOUNTER — Other Ambulatory Visit: Payer: Self-pay | Admitting: Internal Medicine

## 2019-03-15 DIAGNOSIS — N951 Menopausal and female climacteric states: Secondary | ICD-10-CM

## 2019-03-15 DIAGNOSIS — R4586 Emotional lability: Secondary | ICD-10-CM

## 2019-03-22 ENCOUNTER — Telehealth: Payer: Self-pay

## 2019-03-22 ENCOUNTER — Ambulatory Visit: Payer: Self-pay

## 2019-03-22 DIAGNOSIS — I10 Essential (primary) hypertension: Secondary | ICD-10-CM

## 2019-03-22 DIAGNOSIS — J45909 Unspecified asthma, uncomplicated: Secondary | ICD-10-CM

## 2019-03-22 NOTE — Chronic Care Management (AMB) (Signed)
  Care Management   Follow Up Note   03/22/2019 Name: Karen Molina MRN: [id number] DOB: 12/10/1960  Referred by: Minette Brine, FNP Reason for referral : Care Coordination   Unsuccessful outbound call placed to the patient to follow up on progression of housing repair. SW left a HIPAA compliant voice message requesting a return call.  SW will follow up with the patient over the next month.  Daneen Schick, BSW, CDP Social Worker, Certified Dementia Practitioner [place] / [facility] [phone]

## 2019-03-31 ENCOUNTER — Telehealth: Payer: Self-pay

## 2019-03-31 ENCOUNTER — Ambulatory Visit: Payer: Self-pay

## 2019-03-31 DIAGNOSIS — G4733 Obstructive sleep apnea (adult) (pediatric): Secondary | ICD-10-CM

## 2019-03-31 DIAGNOSIS — R7303 Prediabetes: Secondary | ICD-10-CM

## 2019-03-31 DIAGNOSIS — I10 Essential (primary) hypertension: Secondary | ICD-10-CM

## 2019-04-04 NOTE — Chronic Care Management (AMB) (Signed)
Care Management   Initial Visit Note  04/03/2019 Name: Karen Molina MRN: [id number] DOB: 02-04-1961  Referred by: Minette Brine, FNP Reason for referral : Care Coordination (INITIAL CC RN CM Telephone Follow up)   Karen Molina is a 59 y.o. year old female who is a primary care patient of Minette Brine, [place]. The CCM team was consulted for assistance with chronic disease management and care coordination needs related to HTN and OSA; Prediabetes  Review of patient status, including review of consultants reports, relevant laboratory and other test results, and collaboration with appropriate care team members and the patient's provider was performed as part of comprehensive patient evaluation and provision of chronic care management services.    SDOH (Social Determinants of Health) screening performed today: None. See Care Plan for related entries.   Placed initial CCM RN CC outbound call to patient to assess for CCM needs and a care plan was established.   Medications: Outpatient Encounter Medications as of 03/31/2019  Medication Sig  . albuterol (VENTOLIN HFA) 108 (90 Base) MCG/ACT inhaler Inhale 2 puffs into the lungs every 6 (six) hours as needed for wheezing or shortness of breath.  [name] amLODipine (NORVASC) 10 MG tablet TAKE 1 TABLET BY MOUTH EVERY DAY  . Ferrous Sulfate (IRON) 325 (65 Fe) MG TABS Take by mouth. 1 per day  . Magnesium 250 MG TABS Take by mouth. 1 per day  . mometasone-formoterol (DULERA) 100-5 MCG/ACT AERO Inhale 2 puffs into the lungs 2 (two) times daily.  [name] PARoxetine (PAXIL) 20 MG tablet TAKE 1 TABLET BY MOUTH EVERY DAY   No facility-administered encounter medications on file as of 03/31/2019.     Objective:  Lab Results  Component Value Date   HGBA1C 6.3 (H) 02/27/2019   Lab Results  Component Value Date   MICROALBUR 10 11/02/2018   LDLCALC 68 04/20/2016   CREATININE 0.61 04/20/2016   BP Readings from Last 3 Encounters:  02/27/19 134/80  12/28/18  140/88  12/07/18 (!) 158/86    Goals Addressed      Patient Stated   . "To keep my BP well controlled" (pt-stated)       Current Barriers:  [name] Knowledge Deficits related to disease process and Self Health management of HTN . Chronic Disease Management support and education needs related to HTN, OSA with CPAP usage  Nurse Case Manager Clinical Goal(s):  [name] Over the next 90 days, patient will work with the CCM team to address needs related to disease education and support for HTN.   CCM RN CM Interventions: 04/03/19 call completed with patient   . Evaluation of current treatment plan related to HTN and patient's adherence to plan as established by provider. . Provided education to patient re: target BP of 130/80  . Reviewed medications with patient and discussed patient is adhering to taking her Amlodipine exactly as prescribed for antihypertensive control  . Discussed plans with patient for ongoing care management follow up and provided patient with direct contact information for care management team . Advised patient, providing education and rationale, to monitor blood pressure daily and record, calling the CCM team and or PCP for findings outside established parameters.  . Provided patient with printed educational materials related to What is High Blood Pressure; African American's and High Blood Pressure; Why Should I restrict Sodium?; Life's Simple 7  Patient Self Care Activities:  . Self administers medications as prescribed . Attends all scheduled provider appointments . Calls pharmacy for medication refills .  Performs ADL's independently . Performs IADL's independently . Calls provider office for new concerns or questions  Initial goal documentation     . "To lower my A1C" (pt-stated)       Current Barriers:  [name] Knowledge Deficits related to disease process and Self Health management of Diabetes Mellitus . Chronic Disease Management support and education needs related to  DMII, HTN, OSA with CPAP usage  Nurse Case Manager Clinical Goal(s):  [name] Over the next 90 days, patient will work with the CCM team to address needs related to disease education and support for DMII  CCM RN CM Interventions:  04/03/19 call completed with patient  . Evaluation of current treatment plan related to Diabetes and patient's adherence to plan as established by provider. . Provided education to patient re: target A1C and how to achieve this goal with consistent daily glycemic control of 80-130 . Discussed plans with patient for ongoing care management follow up and provided patient with direct contact information for care management team . Provided patient with printed educational materials related to Diabetes Management with Meal Planning; Know Your A1C; signs/symptoms of hypo/hyperglycemia; Diabetes Home Safety Tool  . Advised patient, providing education and rationale, to check cbg before meals and record, calling the CCM team and or PCP for findings outside established parameters.    Patient Self Care Activities:  . Self administers medications as prescribed . Attends all scheduled provider appointments . Calls pharmacy for medication refills . Performs ADL's independently . Performs IADL's independently . Calls provider office for new concerns or questions  Initial goal documentation       Other   . COMPLETED: Assist with Chronic Case Management and Care Coordination needs       Current Barriers:  [name] Knowledge Barriers related to resources and support available to address needs related to Chronic Care Management and Care Coordination   Case Manager Clinical Goal(s):  [name] Over the next 30 days, patient will work with the CCM team to address needs related to Chronic Care Management and Community Resources  Interventions:  . Collaborated with BSW and initiated plan of care to address needs related to Chronic disease management and Care Coordination   Patient Self Care  Activities:  . Self administers medications as prescribed . Attends all scheduled provider appointments . Calls provider office for new concerns or questions  Initial goal documentation     . To better understanding OSA       Current Barriers:  [name] Knowledge Deficits related to disease process and Self Health Management for OSA with CPAP usage . Chronic Disease Management support and education needs related to OSA, HTN  Nurse Case Manager Clinical Goal(s):  [name] Over the next 90 days, patient will work with the CCM team to address needs related to disease education and support for OSA with CPAP usage.   CCM RN CM Interventions:  04/03/19 call completed with patient  . Evaluation of current treatment plan related to OSA and patient's adherence to plan as established by provider. . Reviewed medications with patient and discussed patient is adhering to using her Dulera inhaler for management of Asthma . Discussed plans with patient for ongoing care management follow up and provided patient with direct contact information for care management team . Provided patient with printed educational materials related to OSA  Patient Self Care Activities:  . Self administers medications as prescribed . Attends all scheduled provider appointments . Calls pharmacy for medication refills . Performs ADL's independently . Performs  IADL's independently . Calls provider office for new concerns or questions  Initial goal documentation        Plan:   Telephone follow up appointment with care management team member scheduled for: 05/08/19  Barb Merino, RN, BSN, CCM Care Management Coordinator [place] [facility]  Direct Phone: [phone]

## 2019-04-04 NOTE — Patient Instructions (Signed)
Visit Information  Goals Addressed      Patient Stated   . "To keep my BP well controlled" (pt-stated)       Current Barriers:  [name] Knowledge Deficits related to disease process and Self Health management of HTN . Chronic Disease Management support and education needs related to HTN, OSA with CPAP usage  Nurse Case Manager Clinical Goal(s):  [name] Over the next 90 days, patient will work with the CCM team to address needs related to disease education and support for HTN.   CCM RN CM Interventions: 04/03/19 call completed with patient   . Evaluation of current treatment plan related to HTN and patient's adherence to plan as established by provider. . Provided education to patient re: target BP of 130/80  . Reviewed medications with patient and discussed patient is adhering to taking her Amlodipine exactly as prescribed for antihypertensive control  . Discussed plans with patient for ongoing care management follow up and provided patient with direct contact information for care management team . Advised patient, providing education and rationale, to monitor blood pressure daily and record, calling the CCM team and or PCP for findings outside established parameters.  . Provided patient with printed educational materials related to What is High Blood Pressure; African American's and High Blood Pressure; Why Should I restrict Sodium?; Life's Simple 7  Patient Self Care Activities:  . Self administers medications as prescribed . Attends all scheduled provider appointments . Calls pharmacy for medication refills . Performs ADL's independently . Performs IADL's independently . Calls provider office for new concerns or questions  Initial goal documentation     . "To lower my A1C" (pt-stated)       Current Barriers:  [name] Knowledge Deficits related to disease process and Self Health management of Diabetes Mellitus . Chronic Disease Management support and education needs related to DMII, HTN, OSA  with CPAP usage  Nurse Case Manager Clinical Goal(s):  [name] Over the next 90 days, patient will work with the CCM team to address needs related to disease education and support for DMII  CCM RN CM Interventions:  04/03/19 call completed with patient  . Evaluation of current treatment plan related to Diabetes and patient's adherence to plan as established by provider. . Provided education to patient re: target A1C and how to achieve this goal with consistent daily glycemic control of 80-130 . Discussed plans with patient for ongoing care management follow up and provided patient with direct contact information for care management team . Provided patient with printed educational materials related to Diabetes Management with Meal Planning; Know Your A1C; signs/symptoms of hypo/hyperglycemia; Diabetes Home Safety Tool  . Advised patient, providing education and rationale, to check cbg before meals and record, calling the CCM team and or PCP for findings outside established parameters.    Patient Self Care Activities:  . Self administers medications as prescribed . Attends all scheduled provider appointments . Calls pharmacy for medication refills . Performs ADL's independently . Performs IADL's independently . Calls provider office for new concerns or questions  Initial goal documentation      Other   . COMPLETED: Assist with Chronic Case Management and Care Coordination needs       Current Barriers:  [name] Knowledge Barriers related to resources and support available to address needs related to Chronic Care Management and Care Coordination   Case Manager Clinical Goal(s):  [name] Over the next 30 days, patient will work with the CCM team to address needs related to  Chronic Care Management and Community Resources  Interventions:  . Collaborated with BSW and initiated plan of care to address needs related to Chronic disease management and Care Coordination   Patient Self Care Activities:  . Self  administers medications as prescribed . Attends all scheduled provider appointments . Calls provider office for new concerns or questions  Initial goal documentation     . To better understanding OSA       Current Barriers:  [name] Knowledge Deficits related to disease process and Self Health Management for OSA with CPAP usage . Chronic Disease Management support and education needs related to OSA, HTN  Nurse Case Manager Clinical Goal(s):  [name] Over the next 90 days, patient will work with the CCM team to address needs related to disease education and support for OSA with CPAP usage.   CCM RN CM Interventions:  04/03/19 call completed with patient  . Evaluation of current treatment plan related to OSA and patient's adherence to plan as established by provider. . Reviewed medications with patient and discussed patient is adhering to using her Dulera inhaler for management of Asthma . Discussed plans with patient for ongoing care management follow up and provided patient with direct contact information for care management team . Provided patient with printed educational materials related to OSA  Patient Self Care Activities:  . Self administers medications as prescribed . Attends all scheduled provider appointments . Calls pharmacy for medication refills . Performs ADL's independently . Performs IADL's independently . Calls provider office for new concerns or questions  Initial goal documentation       The patient verbalized understanding of instructions provided today and declined a print copy of patient instruction materials.   Telephone follow up appointment with care management team member scheduled for: 05/08/19  Barb Merino, RN, BSN, CCM Care Management Coordinator [place] [facility]  Direct Phone: [phone]

## 2019-04-13 ENCOUNTER — Ambulatory Visit: Payer: Self-pay

## 2019-04-13 ENCOUNTER — Telehealth: Payer: Self-pay

## 2019-04-13 DIAGNOSIS — I10 Essential (primary) hypertension: Secondary | ICD-10-CM

## 2019-04-13 DIAGNOSIS — J45909 Unspecified asthma, uncomplicated: Secondary | ICD-10-CM

## 2019-04-13 NOTE — Chronic Care Management (AMB) (Signed)
  Care Management   Outreach Note  04/13/2019 Name: Karen Molina MRN: [id number] DOB: 03/03/1961  Referred by: Minette Brine, FNP Reason for referral : Care Coordination   Second unsuccessful outbound call to assess progression of housing repair goal. SW left a HIPAA compliant voice message requesting a return call.  Follow Up Plan: SW will attempt a third and final outbound call over the next 3 weeks.  Daneen Schick, BSW, CDP Social Worker, Certified Dementia Practitioner [place] / [place] [facility] [phone]

## 2019-04-17 ENCOUNTER — Ambulatory Visit: Payer: Self-pay

## 2019-04-17 DIAGNOSIS — I10 Essential (primary) hypertension: Secondary | ICD-10-CM

## 2019-04-17 DIAGNOSIS — J45909 Unspecified asthma, uncomplicated: Secondary | ICD-10-CM

## 2019-04-17 NOTE — Chronic Care Management (AMB) (Signed)
Care Management   Follow Up Note   04/17/2019 Name: Karen Molina MRN: [id number] DOB: [birth year]  Referred by: Karen Brine, FNP Reason for referral : [name] is a 59 y.o. year old female who is a primary care patient of Karen Molina, [place]. The care management team was consulted for assistance with care management and care coordination needs.    Review of patient status, including review of consultants reports, relevant laboratory and other test results, and collaboration with appropriate care team members and the patient's provider was performed as part of comprehensive patient evaluation and provision of chronic care management services.    SW received an inbound call from the patient in response to recent voice message left by SW.  Goals Addressed            This Visit's Progress     Patient Stated   . COMPLETED: "I can't afford to fix my home" (pt-stated)       Current Barriers:  . Financial constraints related to several months without employment within the last year . Lacks knowledge of community programs to assist with home repair . Limited resources available due to age  Clinical Social Work Clinical Goal(s):  [name] Over the next 60 days, patient will work with SW to address needs related to home repair  CCM SW Interventions: Completed 04/17/2019 . SW received inbound call from the patient who reports she has not yet been contacted regarding home repair application . Discussed process for applicants and length of time it may take prior to receiving services . Encouraged the patient to contact SW directly with future resource needs . Goal Met  Patient Self Care Activities:  . Performs ADL's independently . Performs IADL's independently . Calls provider office for new concerns or questions  Please see past updates related to this goal by clicking on the "Past Updates" button in the selected goal      . COMPLETED: "I have a hard time  affording food" (pt-stated)       Current Barriers:  . Financial constraints related to loss of job in 2019  [name]):  [name] Over the next 45 days, client will work with SW to address concerns related to food insecurity  CCM SW Interventions: Completed 01/12/2019 . Outbound call to the patient to assess progression of patient stated goal . Confirmed receipt of mailed resources to assist with food insecurity . Assessed for patient ability to access food pantry resources- patient has no concerns during today's call . Goal Met  Patient Self Care Activities:  . Self administers medications as prescribed . Performs ADL's independently . Performs IADL's independently  Please see past updates related to this goal by clicking on the "Past Updates" button in the selected goal        Other   . COMPLETED: Assist with Chronic Case Management by assessing patients ability to independently manage chronic conditions       Current Barriers:  [name] Knowledge barriers related to the patients ability to manage Chronic Conditions including HTN and Asthma  Clinical Social Work Clinical Goal(s):  [name] Over the next 30 days the patient will work with CCM RN Case Manager to establish an individualized care plan related to the management of patients identified chronic conditions  CCM SW Interventions: [name] Goal Met on 03/31/19- See RN individual goals established to address chronic conditions  Patient Self Care Activities:  . Currently UNABLE TO independently manage chronic  conditions  Please see past updates related to this goal by clicking on the "Past Updates" button in the selected goal          No planned SW follow up at this time. The patient will remain active with nurse case manager and is encouraged to contact SW with future resource needs.  Daneen Schick, BSW, CDP Social Worker, Certified Dementia Practitioner [place] / [place] [facility] [phone]

## 2019-04-17 NOTE — Patient Instructions (Signed)
Social Worker Visit Information  Goals we discussed today:  Goals Addressed            This Visit's Progress     Patient Stated   . COMPLETED: "I can't afford to fix my home" (pt-stated)       Current Barriers:  . Financial constraints related to several months without employment within the last year . Lacks knowledge of community programs to assist with home repair . Limited resources available due to age  Clinical Social Work Clinical Goal(s):  [name] Over the next 60 days, patient will work with SW to address needs related to home repair  CCM SW Interventions: Completed 04/17/2019 . SW received inbound call from the patient who reports she has not yet been contacted regarding home repair application . Discussed process for applicants and length of time it may take prior to receiving services . Encouraged the patient to contact SW directly with future resource needs . Goal Met  Patient Self Care Activities:  . Performs ADL's independently . Performs IADL's independently . Calls provider office for new concerns or questions  Please see past updates related to this goal by clicking on the "Past Updates" button in the selected goal      . COMPLETED: "I have a hard time affording food" (pt-stated)       Current Barriers:  . Financial constraints related to loss of job in 2019  [place]):  [name] Over the next 45 days, client will work with SW to address concerns related to food insecurity  CCM SW Interventions: Completed 01/12/2019 . Outbound call to the patient to assess progression of patient stated goal . Confirmed receipt of mailed resources to assist with food insecurity . Assessed for patient ability to access food pantry resources- patient has no concerns during today's call . Goal Met  Patient Self Care Activities:  . Self administers medications as prescribed . Performs ADL's independently . Performs IADL's independently  Please see past  updates related to this goal by clicking on the "Past Updates" button in the selected goal        Other   . COMPLETED: Assist with Chronic Case Management by assessing patients ability to independently manage chronic conditions       Current Barriers:  [name] Knowledge barriers related to the patients ability to manage Chronic Conditions including HTN and Asthma  Clinical Social Work Clinical Goal(s):  [name] Over the next 30 days the patient will work with CCM RN Case Manager to establish an individualized care plan related to the management of patients identified chronic conditions  CCM SW Interventions: [name] Goal Met on 03/31/19- See RN individual goals established to address chronic conditions  Patient Self Care Activities:  . Currently UNABLE TO independently manage chronic conditions  Please see past updates related to this goal by clicking on the "Past Updates" button in the selected goal          Follow Up Plan: No SW follow up planned at this time. The patient will remain active with nurse case manager and is encouraged to contact SW directly for future resource needs.   Daneen Schick, BSW, CDP Social Worker, Certified Dementia Practitioner [place] [facility] [place] [facility] [phone]

## 2019-04-28 ENCOUNTER — Telehealth: Payer: Self-pay

## 2019-05-01 ENCOUNTER — Telehealth: Payer: Self-pay | Admitting: Pharmacist

## 2019-05-02 ENCOUNTER — Other Ambulatory Visit: Payer: Self-pay | Admitting: Nurse Practitioner

## 2019-05-02 DIAGNOSIS — J45909 Unspecified asthma, uncomplicated: Secondary | ICD-10-CM

## 2019-05-08 ENCOUNTER — Telehealth: Payer: Self-pay

## 2019-06-06 ENCOUNTER — Other Ambulatory Visit: Payer: Self-pay | Admitting: Internal Medicine

## 2019-06-06 DIAGNOSIS — I10 Essential (primary) hypertension: Secondary | ICD-10-CM

## 2019-06-28 ENCOUNTER — Telehealth: Payer: Self-pay | Admitting: Family Medicine

## 2019-06-28 ENCOUNTER — Ambulatory Visit: Payer: Self-pay | Admitting: Nurse Practitioner

## 2019-06-28 NOTE — Telephone Encounter (Signed)
Patient called to inform she is having some issues with her cpap machine and was not sure if it needs to be calibrated or maintenance.

## 2019-06-28 NOTE — Telephone Encounter (Signed)
I spoke to pt and she is having issues with her cpap, but also having issues with pulmonary (asthma, wheezing).  She will see them 07-20-19, then [place] afterwards, next available.  07-25-19 at 1130 with AL/NP.

## 2019-06-29 ENCOUNTER — Ambulatory Visit: Payer: Self-pay

## 2019-06-29 ENCOUNTER — Other Ambulatory Visit: Payer: Self-pay

## 2019-06-29 ENCOUNTER — Telehealth: Payer: Self-pay

## 2019-06-29 ENCOUNTER — Ambulatory Visit: Payer: BC Managed Care – PPO | Admitting: Nurse Practitioner

## 2019-06-29 ENCOUNTER — Encounter: Payer: Self-pay | Admitting: Nurse Practitioner

## 2019-06-29 VITALS — BP 126/74 | HR 94 | Temp 98.1°F | Ht 64.8 in | Wt 189.0 lb

## 2019-06-29 DIAGNOSIS — Z9989 Dependence on other enabling machines and devices: Secondary | ICD-10-CM

## 2019-06-29 DIAGNOSIS — Z87892 Personal history of anaphylaxis: Secondary | ICD-10-CM | POA: Diagnosis not present

## 2019-06-29 DIAGNOSIS — J45909 Unspecified asthma, uncomplicated: Secondary | ICD-10-CM

## 2019-06-29 DIAGNOSIS — G4733 Obstructive sleep apnea (adult) (pediatric): Secondary | ICD-10-CM

## 2019-06-29 DIAGNOSIS — I10 Essential (primary) hypertension: Secondary | ICD-10-CM

## 2019-06-29 MED ORDER — TRIAMCINOLONE ACETONIDE 40 MG/ML IJ SUSP
60.0000 mg | Freq: Once | INTRAMUSCULAR | Status: AC
  Administered 2019-06-29: 60 mg via INTRAMUSCULAR

## 2019-06-29 MED ORDER — PREDNISONE 20 MG PO TABS: ORAL_TABLET | ORAL | 0 refills | Status: DC

## 2019-06-29 MED ORDER — BREO ELLIPTA 200-25 MCG/INH IN AEPB: 1.0000 | INHALATION_SPRAY | Freq: Every day | RESPIRATORY_TRACT | 3 refills | Status: DC

## 2019-06-29 NOTE — Chronic Care Management (AMB) (Signed)
  Care Management   Outreach Note  06/29/2019 Name: Karen Molina MRN: [id number] DOB: 1960-07-04  Referred by: Minette Brine, FNP Reason for referral : Care Coordination   SW placed an unsuccessful outbound call to the patient in response to a voice message received requesting a return call. SW left a HIPAA compliant voice message requesting a return call.  Follow Up Plan: The care management team will reach out to the patient again over the next 10 days.   Daneen Schick, BSW, CDP Social Worker, Certified Dementia Practitioner [place] [facility] [place] [facility] [phone]

## 2019-06-29 NOTE — Progress Notes (Signed)
This visit occurred during the SARS-CoV-2 public health emergency.  Safety protocols were in place, including screening questions prior to the visit, additional usage of staff PPE, and extensive cleaning of exam room while observing appropriate contact time as indicated for disinfecting solutions.  Subjective:     Patient ID: Karen Molina , female    DOB: February 16, 1961 , 59 y.o.   MRN: [id number]   Chief Complaint  Patient presents with  . Asthma    Hard to catch breath at night    HPI  Asthma She complains of difficulty breathing. There is no chest tightness or cough. This is a recurrent problem. Episode onset: 2 days ago. The problem occurs intermittently. The problem has been gradually worsening. Pertinent negatives include no appetite change, chest pain, fever or headaches. Her symptoms are alleviated by ipratropium (dulera; was on dulera in the past). She reports no improvement on treatment. Her past medical history is significant for asthma.     Past Medical History:  Diagnosis Date  . Asthma   . Cervical cancer ([place])   . Chest pain   . Fatigue   . H/O: hysterectomy   . Insomnia   . Palpitations   . Paresthesias    lower extremities     Family History  Problem Relation Age of Onset  . Hypertension Mother   . Diabetes Mother   . Osteoarthritis Mother   . Stroke Maternal Grandmother   . Diabetes Maternal Grandmother   . Diabetes Brother   . Hypertension Brother   . HIV/AIDS Brother   . Drug abuse Brother   . Lupus Sister   . Arthritis Sister   . Breast cancer Daughter   . Asthma Father   . Healthy Brother   . Healthy Brother   . Healthy Son   . Cancer Paternal Grandfather      Current Outpatient Medications:  .  albuterol (VENTOLIN HFA) 108 (90 Base) MCG/ACT inhaler, TAKE 2 PUFFS BY MOUTH EVERY 6 HOURS AS NEEDED FOR WHEEZE OR SHORTNESS OF BREATH, Disp: 18 g, Rfl: 2 .  amLODipine (NORVASC) 10 MG tablet, TAKE 1 TABLET BY MOUTH EVERY DAY, Disp: 90 tablet, Rfl:  1 .  Ferrous Sulfate (IRON) 325 (65 Fe) MG TABS, Take by mouth. 1 per day, Disp: , Rfl:  .  Magnesium 250 MG TABS, Take by mouth. 1 per day, Disp: , Rfl:  .  mometasone-formoterol (DULERA) 100-5 MCG/ACT AERO, Inhale 2 puffs into the lungs 2 (two) times daily., Disp: , Rfl:  .  PARoxetine (PAXIL) 20 MG tablet, TAKE 1 TABLET BY MOUTH EVERY DAY, Disp: 90 tablet, Rfl: 1   Allergies  Allergen Reactions  . Influenza Virus Vacc Split Pf Other (See Comments)    "deadly sick "  . Penicillins Hives, Itching and Swelling     Review of Systems  Constitutional: Negative for appetite change and fever.  Respiratory: Negative for cough.   Cardiovascular: Negative.  Negative for chest pain, palpitations and leg swelling.  Neurological: Negative for dizziness and headaches.  Psychiatric/Behavioral: Negative.      Today's Vitals   06/29/19 1537  BP: 126/74  Pulse: 94  Temp: 98.1 F (36.7 C)  TempSrc: Oral  SpO2: 97%  Weight: 189 lb (85.7 kg)  Height: 5' 4.8" (1.646 m)   Body mass index is 31.65 kg/m.   Objective:  Physical Exam Constitutional:      General: She is not in acute distress.    Appearance: Normal appearance.  Cardiovascular:  Rate and Rhythm: Normal rate and regular rhythm.     Pulses: Normal pulses.     Heart sounds: Normal heart sounds. No murmur.  Pulmonary:     Effort: Respiratory distress present.     Breath sounds: Normal breath sounds. No wheezing.  Musculoskeletal:        General: No tenderness.  Skin:    Capillary Refill: Capillary refill takes less than 2 seconds.  Neurological:     General: No focal deficit present.     Mental Status: She is alert and oriented to person, place, and time.     Cranial Nerves: No cranial nerve deficit.         Assessment And Plan:     1. Extrinsic asthma, unspecified asthma severity, uncomplicated  Decreased breath sounds bilateral upper lobes  Wheezing heard to her throat area  Will treat with kenalog  today  She is scheduled to see the pulmologist for a consultation  - fluticasone furoate-vilanterol (BREO ELLIPTA) 200-25 MCG/INH AEPB; Inhale 1 puff into the lungs daily.  Dispense: 1 each; Refill: 3 - triamcinolone acetonide (KENALOG-40) injection 60 mg - Ambulatory referral to Pulmonology - predniSONE (DELTASONE) 20 MG tablet; Take 2 tablets by mouth daily x 3 days  Dispense: 6 tablet; Refill: 0  2. OSA on CPAP  Will refer to pulmonologist at patient request and for management of CPAP - Ambulatory referral to Pulmonology - predniSONE (DELTASONE) 20 MG tablet; Take 2 tablets by mouth daily x 3 days  Dispense: 6 tablet; Refill: 0  3. History of drug-induced anaphylaxis  She would like to have a covid vaccine but has had an anaphylaxis reaction to the flu vaccine - Ambulatory referral to Allergy   Minette Brine, FNP    THE PATIENT IS ENCOURAGED TO PRACTICE SOCIAL DISTANCING DUE TO THE COVID-19 PANDEMIC.

## 2019-06-29 NOTE — Chronic Care Management (AMB) (Signed)
  Care Management   Follow Up Note   06/29/2019 Name: Karen Molina MRN: [id number] DOB: 10-Nov-1960  Referred by: Minette Brine, FNP Reason for referral : [place] is a 59 y.o. year old female who is a primary care patient of Minette Brine, [place]. The care management team was consulted for assistance with care management and care coordination needs.    Review of patient status, including review of consultants reports, relevant laboratory and other test results, and collaboration with appropriate care team members and the patient's provider was performed as part of comprehensive patient evaluation and provision of chronic care management services.    SW spoke with the patient today who reports concern for worsening Asthma. The patient reports she had to miss work today due to symptoms and is sleeping upright at night in order to breathe easier. The patient reports having to cancel a previous appointment to see her primary provider due to needing to assist her mother with care needs.  SW collaborated with the patients primary care team including Minette Brine, FNP and Barb Merino, [place]. SW has scheduled the patient for an office visit at 4:30 pm to be seen by her primary provider.   SW will follow up with the patient over the next two weeks to assess for ongoing care coordination needs.  Daneen Schick, BSW, CDP Social Worker, Certified Dementia Practitioner [place] [facility] [place] [facility] [phone]

## 2019-06-30 ENCOUNTER — Ambulatory Visit: Payer: Self-pay

## 2019-06-30 ENCOUNTER — Telehealth: Payer: Self-pay

## 2019-06-30 DIAGNOSIS — I10 Essential (primary) hypertension: Secondary | ICD-10-CM

## 2019-06-30 DIAGNOSIS — R7303 Prediabetes: Secondary | ICD-10-CM

## 2019-06-30 DIAGNOSIS — J45909 Unspecified asthma, uncomplicated: Secondary | ICD-10-CM

## 2019-06-30 DIAGNOSIS — G4733 Obstructive sleep apnea (adult) (pediatric): Secondary | ICD-10-CM

## 2019-07-04 NOTE — Chronic Care Management (AMB) (Signed)
Chronic Care Management   Follow Up Note   07/04/2019 Name: Karen Molina MRN: [id number] DOB: 26-Sep-1960  Referred by: Minette Brine, FNP Reason for referral : Chronic Care Management (FU RN Call - Asthma)   Karen Molina is a 59 y.o. year old female who is a primary care patient of Minette Brine, [place]. The CCM team was consulted for assistance with chronic disease management and care coordination needs.    Review of patient status, including review of consultants reports, relevant laboratory and other test results, and collaboration with appropriate care team members and the patient's provider was performed as part of comprehensive patient evaluation and provision of chronic care management services.    SDOH (Social Determinants of Health) assessments performed: Yes See Care Plan activities for detailed interventions related to [place])   Placed outbound CCM RN CM call to patient to f/u on her Asthma exacerbation.     Outpatient Encounter Medications as of 06/30/2019  Medication Sig  . fluticasone furoate-vilanterol (BREO ELLIPTA) 200-25 MCG/INH AEPB Inhale 1 puff into the lungs daily.  . Magnesium 250 MG TABS Take by mouth. 1 per day  . predniSONE (DELTASONE) 20 MG tablet Take 2 tablets by mouth daily x 3 days  . albuterol (VENTOLIN HFA) 108 (90 Base) MCG/ACT inhaler TAKE 2 PUFFS BY MOUTH EVERY 6 HOURS AS NEEDED FOR WHEEZE OR SHORTNESS OF BREATH  . amLODipine (NORVASC) 10 MG tablet TAKE 1 TABLET BY MOUTH EVERY DAY  . Ferrous Sulfate (IRON) 325 (65 Fe) MG TABS Take by mouth. 1 per day  . mometasone-formoterol (DULERA) 100-5 MCG/ACT AERO Inhale 2 puffs into the lungs 2 (two) times daily.  [name] PARoxetine (PAXIL) 20 MG tablet TAKE 1 TABLET BY MOUTH EVERY DAY   No facility-administered encounter medications on file as of 06/30/2019.     Objective:   Goals Addressed            This Visit's Progress     Patient Stated   . "I would like to get my Asthma under better control"  (pt-stated)       CARE PLAN ENTRY (see longitudinal plan of care for additional care plan information)  Current Barriers:  [name] Knowledge Deficits related to disease process and Self Health management of Asthma . Chronic Disease Management support and education needs related to Extrinsic Asthma, OSA on CPAP, HTN, Prediabetes  Nurse Case Manager Clinical Goal(s):  [name] Over the next 90 days, patient will work with the CCM team and Pulmonology  to address needs related to disease education and support to improve Self Health management of Asthma   CCM RN CM Interventions:  . Inter-disciplinary care team collaboration (see longitudinal plan of care) . Evaluation of current treatment plan related to Asthma and patient's adherence to plan as established by provider. . Provided education to patient re: disease process and potential triggers that may exacerbate Asthma; Educated patient on importance of keeping MD appointment with Pulmonologist for further evaluation and treatment of this condition; Educated on the importance of using prescribed inhalers exactly as prescribed w/o missed doses for best effectiveness; Educated on when to call the CCM RN and or MD for new or worsening symptoms . Reviewed medications with patient and discussed patient has completed her prescribed dose of Prednisone and has started the [facility] inhaler as prescribed; Determined patient is having financial hardship paying for this inhaler; Provided patient with the contact number for [facility] customer service for support of financial hardship [phone] . Discussed plans with  patient for ongoing care management follow up and provided patient with direct contact information for care management team . Provided patient with printed educational materials related to Asthma . Reviewed scheduled/upcoming provider appointments including: new patient f/u with [facility] scheduled for 07/20/19. Encouraged patient to contact this  office to ask to be added to a cancellation list for an earlier appointment if one comes available prior to 4/29  Patient Self Care Activities:  . Self administers medications as prescribed . Attends all scheduled provider appointments . Calls pharmacy for medication refills . Performs ADL's independently . Performs IADL's independently . Calls provider office for new concerns or questions  Initial goal documentation     . COMPLETED: "The Memory Dance is too expensive for me to buy every month" (pt-stated)       Current Barriers:  . Financial constraints related to loss of job in 2019  [place]):  [name] Over the next 30 days the patient will work with embedded PharmD to address concerns related to medication costs  CCM SW Interventions: Completed 12/13/2018 . Patient interviewed and appropriate assessments performed . Provided patient with information about the Chronic Care Management program . Advised the patient to expect a call from embedded PharmD Lottie Dawson over the coming weeks to assist with medication cost concerns . Collaboration with Mrs Blanca Friend via in-basket message to inform of patient stated goal   CCM PharmD Interventions:  Completed clinic visit with patient on 12/28/18 . Unfortunately, all inahlers for patient's asthma are Brand name and $30 copays.  No copay cards exist for LABA/ICS (Symbicort, Dulera, Breo, etc).  Will provide patient with [facility] inhaler equivalent to try and see if this will work the same as Adair Patter has.  If so, we can apply for [employer] Patient Assistance program. . Patient has 2 more months of Breo Ellipta remaining.  Encouraged patient to continuing using Breo as prescribed.  She reports this is controlling her asthma . Patient requesting Pulmonology referral-->message relayed to PCP . Dulera sample provided for patient to try once Memory Dance is completed.    Patient Self Care Activities:  . Self administers medications as  prescribed . Attends all scheduled provider appointments . Calls provider office for new concerns or questions  Please see past updates related to this goal by clicking on the "Past Updates" button in the selected goal         Plan:   Telephone follow up appointment with care management team member scheduled for: 07/14/19   Barb Merino, RN, BSN, CCM Care Management Coordinator [place] [facility]  Direct Phone: [phone]

## 2019-07-04 NOTE — Patient Instructions (Signed)
Visit Information  Goals Addressed      Patient Stated   . "I would like to get my Asthma under better control" (pt-stated)       CARE PLAN ENTRY (see longitudinal plan of care for additional care plan information)  Current Barriers:  [name] Knowledge Deficits related to disease process and Self Health management of Asthma . Chronic Disease Management support and education needs related to Extrinsic Asthma, OSA on CPAP, HTN, Prediabetes  Nurse Case Manager Clinical Goal(s):  [name] Over the next 90 days, patient will work with the CCM team and Pulmonology  to address needs related to disease education and support to improve Self Health management of Asthma   CCM RN CM Interventions:  . Inter-disciplinary care team collaboration (see longitudinal plan of care) . Evaluation of current treatment plan related to Asthma and patient's adherence to plan as established by provider. . Provided education to patient re: disease process and potential triggers that may exacerbate Asthma; Educated patient on importance of keeping MD appointment with Pulmonologist for further evaluation and treatment of this condition; Educated on the importance of using prescribed inhalers exactly as prescribed w/o missed doses for best effectiveness; Educated on when to call the CCM RN and or MD for new or worsening symptoms . Reviewed medications with patient and discussed patient has completed her prescribed dose of Prednisone and has started the [facility] inhaler as prescribed; Determined patient is having financial hardship paying for this inhaler; Provided patient with the contact number for [facility] customer service for support of financial hardship [phone] . Discussed plans with patient for ongoing care management follow up and provided patient with direct contact information for care management team . Provided patient with printed educational materials related to Asthma . Reviewed scheduled/upcoming provider  appointments including: new patient f/u with [place] [facility] [facility] scheduled for 07/20/19. Encouraged patient to contact this office to ask to be added to a cancellation list for an earlier appointment if one comes available prior to 4/29  Patient Self Care Activities:  . Self administers medications as prescribed . Attends all scheduled provider appointments . Calls pharmacy for medication refills . Performs ADL's independently . Performs IADL's independently . Calls provider office for new concerns or questions  Initial goal documentation     . COMPLETED: "The Memory Dance is too expensive for me to buy every month" (pt-stated)       Current Barriers:  . Financial constraints related to loss of job in 2019  [place]):  [name] Over the next 30 days the patient will work with embedded PharmD to address concerns related to medication costs  CCM SW Interventions: Completed 12/13/2018 . Patient interviewed and appropriate assessments performed . Provided patient with information about the Chronic Care Management program . Advised the patient to expect a call from embedded PharmD Lottie Dawson over the coming weeks to assist with medication cost concerns . Collaboration with Mrs Blanca Friend via in-basket message to inform of patient stated goal   CCM PharmD Interventions:  Completed clinic visit with patient on 12/28/18 . Unfortunately, all inahlers for patient's asthma are Brand name and $30 copays.  No copay cards exist for LABA/ICS (Symbicort, Dulera, Breo, etc).  Will provide patient with [facility] inhaler equivalent to try and see if this will work the same as Adair Patter has.  If so, we can apply for [employer] Patient Assistance program. . Patient has 2 more months of Breo Ellipta remaining.  Encouraged patient to continuing using Breo as  prescribed.  She reports this is controlling her asthma . Patient requesting Pulmonology referral-->message relayed to PCP . Dulera sample provided  for patient to try once Memory Dance is completed.    Patient Self Care Activities:  . Self administers medications as prescribed . Attends all scheduled provider appointments . Calls provider office for new concerns or questions  Please see past updates related to this goal by clicking on the "Past Updates" button in the selected goal         Patient verbalizes understanding of instructions provided today.   Telephone follow up appointment with care management team member scheduled for: 07/14/19  Barb Merino, RN, BSN, CCM Care Management Coordinator [place] [facility]  Direct Phone: [phone]

## 2019-07-05 ENCOUNTER — Ambulatory Visit: Payer: Self-pay

## 2019-07-05 DIAGNOSIS — J45909 Unspecified asthma, uncomplicated: Secondary | ICD-10-CM

## 2019-07-05 NOTE — Chronic Care Management (AMB) (Signed)
  Care Management   Follow Up Note   07/05/2019 Name: [name] MRN: [id number] DOB: 1960/05/14  Referred by: Minette Brine, FNP Reason for referral : [name] is a 59 y.o. year old female who is a primary care patient of Minette Brine, [place]. The care management team was consulted for assistance with care management and care coordination needs.    Review of patient status, including review of consultants reports, relevant laboratory and other test results, and collaboration with appropriate care team members and the patient's provider was performed as part of comprehensive patient evaluation and provision of chronic care management services.    SW placed a successful outbound call to the patient to assist with care coordination needs. Confirmed patients involvement with RN Care Manager regarding ongoing Asthma concerns and knowledge of upcoming pulmonology appointment. Encouraged the patient to contact SW with future care coordination needs.   Daneen Schick, BSW, CDP Social Worker, Certified Dementia Practitioner [place] [facility] [place] [facility] [phone]

## 2019-07-14 ENCOUNTER — Telehealth: Payer: Self-pay

## 2019-07-20 ENCOUNTER — Ambulatory Visit: Payer: BC Managed Care – PPO | Admitting: Pulmonary Disease

## 2019-07-20 ENCOUNTER — Other Ambulatory Visit: Payer: Self-pay

## 2019-07-20 ENCOUNTER — Encounter: Payer: Self-pay | Admitting: Pulmonary Disease

## 2019-07-20 VITALS — BP 132/72 | HR 83 | Temp 98.5°F | Ht 66.0 in | Wt 196.2 lb

## 2019-07-20 DIAGNOSIS — J45909 Unspecified asthma, uncomplicated: Secondary | ICD-10-CM

## 2019-07-20 NOTE — Patient Instructions (Signed)
History of asthma  Continue Breo, albuterol as needed Pay attention to triggers and avoid as needed  Continue CPAP use on a regular basis  I will follow up with you in about 3 months We will try and obtain a breathing study on the day you come in for the next visit  Call with significant concerns  Graded exercises as tolerated

## 2019-07-20 NOTE — Progress Notes (Signed)
Karen Molina    [id number]    02-12-1961  Primary Care Physician:Moore, Doreene Burke, [place]  Referring Physician: Minette Brine, [place] [address] [place],  [place] [address]  Chief complaint:   Patient is being seen for shortness of breath  HPI:  Patient with a history of asthma History of obstructive sleep apnea  Asthma is lifelong, no specific known triggers Has been having more symptoms recently Memory Dance seems to be helping  Obstructive sleep apnea diagnosed in 2018, severe obstructive sleep apnea has been on CPAP therapy Sometimes finds it difficult to sleep through the night with the CPAP on  Denies any chest pains or chest discomfort Does get short of breath with moderate activity  Reformed smoker quit over 25 years ago, smoked up to 1 pack a day  No family history of obstructive lung disease  Exercise limit about three flights of steps, can walk long distances on level and not in a hurry Never intubated  No pets  Outpatient Encounter Medications as of 07/20/2019  Medication Sig  . albuterol (VENTOLIN HFA) 108 (90 Base) MCG/ACT inhaler TAKE 2 PUFFS BY MOUTH EVERY 6 HOURS AS NEEDED FOR WHEEZE OR SHORTNESS OF BREATH  . amLODipine (NORVASC) 10 MG tablet TAKE 1 TABLET BY MOUTH EVERY DAY  . Ferrous Sulfate (IRON) 325 (65 Fe) MG TABS Take by mouth. 1 per day  . fluticasone furoate-vilanterol (BREO ELLIPTA) 200-25 MCG/INH AEPB Inhale 1 puff into the lungs daily.  . Magnesium 250 MG TABS Take by mouth. 1 per day  . mometasone-formoterol (DULERA) 100-5 MCG/ACT AERO Inhale 2 puffs into the lungs 2 (two) times daily.  [name] PARoxetine (PAXIL) 20 MG tablet TAKE 1 TABLET BY MOUTH EVERY DAY  . [DISCONTINUED] predniSONE (DELTASONE) 20 MG tablet Take 2 tablets by mouth daily x 3 days   No facility-administered encounter medications on file as of 07/20/2019.    Allergies as of 07/20/2019 - Review Complete 07/20/2019  Allergen Reaction Noted  . Influenza virus vacc  split pf Other (See Comments) 01/08/2011  . Penicillins Hives, Itching, and Swelling 01/08/2011    Past Medical History:  Diagnosis Date  . Asthma   . Cervical cancer ([place])   . Chest pain   . Fatigue   . H/O: hysterectomy   . Insomnia   . Palpitations   . Paresthesias    lower extremities    Past Surgical History:  Procedure Laterality Date  . ABDOMINAL HYSTERECTOMY    . CERVICAL ABLATION    . INGUINAL HERNIA REPAIR    . UTERINE FIBROID SURGERY      Family History  Problem Relation Age of Onset  . Hypertension Mother   . Diabetes Mother   . Osteoarthritis Mother   . Stroke Maternal Grandmother   . Diabetes Maternal Grandmother   . Diabetes Brother   . Hypertension Brother   . HIV/AIDS Brother   . Drug abuse Brother   . Lupus Sister   . Arthritis Sister   . Breast cancer Daughter   . Asthma Father   . Healthy Brother   . Healthy Brother   . Healthy Son   . Cancer Paternal Grandfather     Social History   Socioeconomic History  . Marital status: Divorced    Spouse name: Not on file  . Number of children: 2  . Years of education: College  . Highest education level: Not on file  Occupational History  . Occupation: [employer]   Tobacco Use  . Smoking status: Former Smoker    Packs/day: 0.50    Years: 20.00    Pack years: 10.00    Types: Cigarettes    Quit date: 03/23/1996    Years since quitting: 23.3  . Smokeless tobacco: Never Used  Substance and Sexual Activity  . Alcohol use: Yes    Alcohol/week: 4.0 standard drinks    Types: 4 Cans of beer per week    Comment: wine on weekends  . Drug use: No    Comment: Quit 03/2003  . Sexual activity: Not on file  Other Topics Concern  . Not on file  Social History Narrative   Diet: Regular        Do you drink/ eat things with caffeine? No      Marital status:    Single                           What year were you married ?      Do you live in a house, apartment,assistred living, condo, trailer, etc.)?  House      Is it one or more stories? 2      How many persons live in your home ? 2      Do you have any pets in your home ?(please list) No      Current or past profession: Quality Auditor       Do you exercise? Yes                             Type & how often: Walk      Do you have a living will? No      Do you have a DNR form?  No                     If not, do you want to discuss one? Yes      Do you have signed POA?HPOA forms?  No               If so, please bring to your        appointment   Denies caffeine use    Social Determinants of Health   Financial Resource Strain: Medium Risk  . Difficulty of Paying Living Expenses: Somewhat hard  Food Insecurity: Food Insecurity Present  . Worried About Charity fundraiser in the Last Year: Sometimes true  . Ran Out of Food in the Last Year: Never true  Transportation Needs: No Transportation Needs  . Lack of Transportation (Medical): No  . Lack of Transportation (Non-Medical): No  Physical Activity:   . Days of Exercise per Week:   . Minutes of Exercise per Session:   Stress:   . Feeling of Stress :   Social Connections:   . Frequency of Communication with Friends and Family:   . Frequency of Social Gatherings with Friends and Family:   . Attends Religious Services:   . Active Member of Clubs or Organizations:   . Attends Archivist Meetings:   [name] Marital Status:   Intimate Partner Violence:   . Fear of Current or Ex-Partner:   . Emotionally Abused:   [name] Physically Abused:   . Sexually Abused:     Review of Systems  Constitutional: Negative.   HENT: Negative.   Respiratory: Positive for shortness of breath and  wheezing.   Cardiovascular: Negative.   Gastrointestinal: Negative.   All other systems reviewed and are negative.   Vitals:   07/20/19 1432  BP: 132/72  Pulse: 83  Temp: 98.5 F (36.9 C)  SpO2: 97%     Physical Exam  Constitutional: She is oriented to person, place, and time. She  appears well-developed and well-nourished.  HENT:  Head: Normocephalic and atraumatic.  Eyes: Pupils are equal, round, and reactive to light. Right eye exhibits no discharge.  Neck: No thyromegaly present.  Cardiovascular: Normal rate, regular rhythm and normal heart sounds.  Pulmonary/Chest: Effort normal. No respiratory distress. She has no wheezes. She has no rales. She exhibits no tenderness.  Musculoskeletal:        General: No edema. Normal range of motion.     Cervical back: Normal range of motion and neck supple.  Neurological: She is alert and oriented to person, place, and time.  Skin: Skin is warm.  Psychiatric: She has a normal mood and affect.   Data Reviewed: No recent chest x-ray or PFT PFT from 2016 revealed small airway disease with significant bronchodilator response  Assessment:  History of asthma Probable obstructive airway disease Obstructive sleep apnea -On CPAP therapy  Plan/Recommendations: Continue Breo Continue albuterol as needed Graded exercises Obtain pulmonary function tests  Encouraged to call with any significant concerns  Follow-up in 3 months   Sherrilyn Rist MD [facility] 07/20/2019, 3:07 PM  CC: Minette Brine, FNP

## 2019-07-25 ENCOUNTER — Telehealth: Payer: Self-pay | Admitting: Allergy

## 2019-07-25 ENCOUNTER — Ambulatory Visit: Payer: Self-pay | Admitting: Family Medicine

## 2019-07-25 NOTE — Telephone Encounter (Signed)
Thank you Ashleigh. I will call her back and schedule.

## 2019-07-25 NOTE — Telephone Encounter (Signed)
I was calling patient in the referral work Q and came across this patient. When I spoke with her, she said her doctor wanted her tested to see if she would be allergic to the Covid vaccine. She has not had the 1st shot yet. I thought you might want to speak with her and see if she qualified for the component testing or if you wanted her scheduled for allergy testing.

## 2019-07-25 NOTE — Telephone Encounter (Signed)
Given that she has not yet received the first COVID vaccine I think that the best thing to do would be to schedule her for a new patient visit and have her discuss this in office before we do COVID component testing. Karen Molina.

## 2019-07-27 ENCOUNTER — Other Ambulatory Visit: Payer: Self-pay

## 2019-07-27 ENCOUNTER — Telehealth: Payer: Self-pay

## 2019-07-27 ENCOUNTER — Ambulatory Visit: Payer: Self-pay

## 2019-07-27 DIAGNOSIS — J45909 Unspecified asthma, uncomplicated: Secondary | ICD-10-CM

## 2019-07-27 DIAGNOSIS — G4733 Obstructive sleep apnea (adult) (pediatric): Secondary | ICD-10-CM

## 2019-07-27 DIAGNOSIS — R7303 Prediabetes: Secondary | ICD-10-CM

## 2019-07-27 DIAGNOSIS — I10 Essential (primary) hypertension: Secondary | ICD-10-CM

## 2019-07-28 NOTE — Chronic Care Management (AMB) (Addendum)
  Care Management   Follow Up Note   07/27/2019 Name: Karen Molina MRN: [id number] DOB: 17-Nov-1960  Referred by: Minette Brine, FNP Reason for referral : Care Coordination (FU RN Call-Asthma )   Karen Molina is a 59 y.o. year old female who is a primary care patient of Minette Brine, [place]. The CCM team was consulted for assistance with chronic disease management and care coordination needs.    Review of patient status, including review of consultants reports, relevant laboratory and other test results, and collaboration with appropriate care team members and the patient's provider was performed as part of comprehensive patient evaluation and provision of chronic care management services.    Chart review completed in preparation to contact patient.     Outpatient Encounter Medications as of 07/27/2019  Medication Sig  . albuterol (VENTOLIN HFA) 108 (90 Base) MCG/ACT inhaler TAKE 2 PUFFS BY MOUTH EVERY 6 HOURS AS NEEDED FOR WHEEZE OR SHORTNESS OF BREATH  . amLODipine (NORVASC) 10 MG tablet TAKE 1 TABLET BY MOUTH EVERY DAY  . Ferrous Sulfate (IRON) 325 (65 Fe) MG TABS Take by mouth. 1 per day  . fluticasone furoate-vilanterol (BREO ELLIPTA) 200-25 MCG/INH AEPB Inhale 1 puff into the lungs daily.  . Magnesium 250 MG TABS Take by mouth. 1 per day  . mometasone-formoterol (DULERA) 100-5 MCG/ACT AERO Inhale 2 puffs into the lungs 2 (two) times daily.  [name] PARoxetine (PAXIL) 20 MG tablet TAKE 1 TABLET BY MOUTH EVERY DAY   No facility-administered encounter medications on file as of 07/27/2019.    Plan:   Telephone follow up appointment with care management team member scheduled for: 07/31/19  Barb Merino, RN, BSN, CCM Care Management Coordinator [place] [facility]  Direct Phone: [phone]

## 2019-07-31 ENCOUNTER — Other Ambulatory Visit: Payer: Self-pay

## 2019-07-31 ENCOUNTER — Ambulatory Visit: Payer: Self-pay

## 2019-07-31 ENCOUNTER — Telehealth: Payer: Self-pay

## 2019-07-31 DIAGNOSIS — Z9989 Dependence on other enabling machines and devices: Secondary | ICD-10-CM

## 2019-07-31 DIAGNOSIS — G4733 Obstructive sleep apnea (adult) (pediatric): Secondary | ICD-10-CM

## 2019-07-31 DIAGNOSIS — I10 Essential (primary) hypertension: Secondary | ICD-10-CM

## 2019-07-31 DIAGNOSIS — J45909 Unspecified asthma, uncomplicated: Secondary | ICD-10-CM

## 2019-07-31 DIAGNOSIS — R7303 Prediabetes: Secondary | ICD-10-CM

## 2019-08-01 NOTE — Chronic Care Management (AMB) (Signed)
Care Management   Follow Up Note   08/01/2019 Name: Karen Molina MRN: [id number] DOB: 1960-04-09  Referred by: Minette Brine, FNP Reason for referral : Care Coordination (FU RN Call-Asthma )   LATREESE DOTTERER is a 59 y.o. year old female who is a primary care patient of Minette Brine, [place]. The CCM team was consulted for assistance with chronic disease management and care coordination needs.    Review of patient status, including review of consultants reports, relevant laboratory and other test results, and collaboration with appropriate care team members and the patient's provider was performed as part of comprehensive patient evaluation and provision of chronic care management services.    SDOH (Social Determinants of Health) assessments performed: No See Care Plan activities for detailed interventions related to Karen Molina)   Placed outbound CCM RN CM follow up call to patient to f/u on her Pulmonology visit for evaluation and treatment of her Asthma.    Outpatient Encounter Medications as of 07/31/2019  Medication Sig  . albuterol (VENTOLIN HFA) 108 (90 Base) MCG/ACT inhaler TAKE 2 PUFFS BY MOUTH EVERY 6 HOURS AS NEEDED FOR WHEEZE OR SHORTNESS OF BREATH  . amLODipine (NORVASC) 10 MG tablet TAKE 1 TABLET BY MOUTH EVERY DAY  . Ferrous Sulfate (IRON) 325 (65 Fe) MG TABS Take by mouth. 1 per day  . fluticasone furoate-vilanterol (BREO ELLIPTA) 200-25 MCG/INH AEPB Inhale 1 puff into the lungs daily.  . Magnesium 250 MG TABS Take by mouth. 1 per day  . mometasone-formoterol (DULERA) 100-5 MCG/ACT AERO Inhale 2 puffs into the lungs 2 (two) times daily.  Karen Molina PARoxetine (PAXIL) 20 MG tablet TAKE 1 TABLET BY MOUTH EVERY DAY   No facility-administered encounter medications on file as of 07/31/2019.     Objective:  Lab Results  Component Value Date   HGBA1C 6.3 (H) 02/27/2019   Lab Results  Component Value Date   MICROALBUR 10 11/02/2018   LDLCALC 68 04/20/2016   CREATININE 0.61 04/20/2016    BP Readings from Last 3 Encounters:  07/20/19 132/72  06/29/19 126/74  02/27/19 134/80    Goals Addressed      Patient Stated   . "I would like to get my Asthma under better control" (pt-stated)   On track    [place] (see longitudinal plan of care for additional care plan information)  Current Barriers:  Karen Molina Knowledge Deficits related to disease process and Self Health management of Asthma . Chronic Disease Management support and education needs related to Extrinsic Asthma, OSA on CPAP, HTN, Prediabetes  Nurse Case Manager Clinical Goal(s):  Karen Molina Over the next 90 days, patient will work with the CCM team and Pulmonology  to address needs related to disease education and support to improve Self Health management of Asthma   CCM RN CM Interventions:  08/01/19 call completed with patient (pt requested to keep call brief due to she is at work) . Inter-disciplinary care team collaboration (see longitudinal plan of care) . Evaluation of current treatment plan related to Asthma and patient's adherence to plan as established by provider. . Determined patient completed her Pulmonary evaluation at [facility] Pulmonology on 07/20/19 . Discussed the plan of care as recommended by Dr. Ander Slade:  o Data Reviewed: o No recent chest x-ray or PFT o PFT from 2016 revealed small airway disease with significant bronchodilator response o Assessment:  o History of asthma o Probable obstructive airway disease o Obstructive sleep apnea o -On CPAP therapy o Plan/Recommendations: o Surveyor, mining  o Continue albuterol as needed o Graded exercises o Obtain pulmonary function tests o Encouraged to call with any significant concerns o Follow-up in 3 months . Discussed patient has not contact the Breo manufacturer to request financial assistance with cost; Provided patient with the contact number for [employer] customer service for support of financial hardship [phone] . Discussed patient plans  to contact the company ASAP will keep this RNCM updated accordingly . Discussed plans with patient for ongoing care management follow up and provided patient with direct contact information for care management team  Patient Self Care Activities:  . Self administers medications as prescribed . Attends all scheduled provider appointments . Calls pharmacy for medication refills . Performs ADL's independently . Performs IADL's independently . Calls provider office for new concerns or questions  Please see past updates related to this goal by clicking on the "Past Updates" button in the selected goal       Plan:   Telephone follow up appointment with care management team member scheduled for: 09/12/19  Barb Merino, RN, BSN, CCM Care Management Coordinator [place] [facility]  Direct Phone: [phone]

## 2019-08-01 NOTE — Patient Instructions (Signed)
Visit Information  Goals Addressed      Patient Stated   . "I would like to get my Asthma under better control" (pt-stated)   On track    [place] (see longitudinal plan of care for additional care plan information)  Current Barriers:  [name] Knowledge Deficits related to disease process and Self Health management of Asthma . Chronic Disease Management support and education needs related to Extrinsic Asthma, OSA on CPAP, HTN, Prediabetes  Nurse Case Manager Clinical Goal(s):  [name] Over the next 90 days, patient will work with the CCM team and Pulmonology  to address needs related to disease education and support to improve Self Health management of Asthma   CCM RN CM Interventions:  08/01/19 call completed with patient (pt requested to keep call brief due to she is at work) . Inter-disciplinary care team collaboration (see longitudinal plan of care) . Evaluation of current treatment plan related to Asthma and patient's adherence to plan as established by provider. . Determined patient completed her Pulmonary evaluation at [facility] Pulmonology on 07/20/19 . Discussed the plan of care as recommended by Dr. Ander Slade:  o Data Reviewed: o No recent chest x-ray or PFT o PFT from 2016 revealed small airway disease with significant bronchodilator response o Assessment:  o History of asthma o Probable obstructive airway disease o Obstructive sleep apnea o -On CPAP therapy o Plan/Recommendations: o Continue Breo o Continue albuterol as needed o Graded exercises o Obtain pulmonary function tests o Encouraged to call with any significant concerns o Follow-up in 3 months . Discussed patient has not contact the Breo manufacturer to request financial assistance with cost; Provided patient with the contact number for [employer] customer service for support of financial hardship [phone] . Discussed patient plans to contact the company ASAP will keep this RNCM updated  accordingly . Discussed plans with patient for ongoing care management follow up and provided patient with direct contact information for care management team  Patient Self Care Activities:  . Self administers medications as prescribed . Attends all scheduled provider appointments . Calls pharmacy for medication refills . Performs ADL's independently . Performs IADL's independently . Calls provider office for new concerns or questions  Please see past updates related to this goal by clicking on the "Past Updates" button in the selected goal        Patient verbalizes understanding of instructions provided today.   Telephone follow up appointment with care management team member scheduled for:  09/12/19 Barb Merino, RN, BSN, CCM Care Management Coordinator [place] [facility]  Direct Phone: [phone]

## 2019-09-04 ENCOUNTER — Ambulatory Visit: Payer: BC Managed Care – PPO | Admitting: Allergy

## 2019-09-04 ENCOUNTER — Other Ambulatory Visit: Payer: Self-pay

## 2019-09-04 ENCOUNTER — Encounter: Payer: Self-pay | Admitting: Allergy

## 2019-09-04 VITALS — BP 128/80 | HR 89 | Temp 98.4°F | Resp 16 | Ht 65.0 in | Wt 194.8 lb

## 2019-09-04 DIAGNOSIS — T50905D Adverse effect of unspecified drugs, medicaments and biological substances, subsequent encounter: Secondary | ICD-10-CM

## 2019-09-04 DIAGNOSIS — Z91038 Other insect allergy status: Secondary | ICD-10-CM

## 2019-09-04 DIAGNOSIS — Z7189 Other specified counseling: Secondary | ICD-10-CM | POA: Diagnosis not present

## 2019-09-04 DIAGNOSIS — J310 Chronic rhinitis: Secondary | ICD-10-CM

## 2019-09-04 DIAGNOSIS — J4540 Moderate persistent asthma, uncomplicated: Secondary | ICD-10-CM

## 2019-09-04 DIAGNOSIS — Z91030 Bee allergy status: Secondary | ICD-10-CM

## 2019-09-04 DIAGNOSIS — Z7185 Encounter for immunization safety counseling: Secondary | ICD-10-CM

## 2019-09-04 MED ORDER — SPIRIVA RESPIMAT 1.25 MCG/ACT IN AERS: 2.0000 | INHALATION_SPRAY | Freq: Every day | RESPIRATORY_TRACT | 5 refills | Status: DC

## 2019-09-04 MED ORDER — EPINEPHRINE 0.3 MG/0.3ML IJ SOAJ: 0.3000 mg | INTRAMUSCULAR | 2 refills | Status: AC | PRN

## 2019-09-04 NOTE — Progress Notes (Signed)
New Patient Note  RE: Karen Molina MRN: [id number] DOB: [birth year] Date of Office Visit: 09/04/2019  Referring provider: Minette Brine, FNP Primary care provider: Minette Brine, FNP  Chief Complaint: Allergic Reaction (allergy to flu shot, would like to get covid vaccine but has questions )  History of Present Illness: I had the pleasure of seeing Karen Molina for initial evaluation at the [facility] on 09/05/2019. She is a 59 y.o. female, who is referred here by Minette Brine, FNP for the evaluation of drug allergies. Up to date with COVID-19 vaccine: no  Drug allergy: Any known reactions to polyethylene glycol or polysorbate?  No.   Any history of anaphylaxis to vaccinations? Patient received her flu vaccine over 10+ years ago and passed out right afterwards. She is not sure what happened but when she woke up she was in the ICU. Apparently, she had trouble breathing and they had to use a defibrillator. She was in the ICU intubated for about 2 weeks. This was the first time she had the flu vaccine and has been avoiding it since then.  Patient had her tetanus vaccine and childhood immunizations with no issues.   Patient is not allergic to eggs and tolerates jello.  Latex condoms caused rash in the past.   Any history of reactions to injectable medications? Patient had multiple steroid injections with no issues for her joints.   Any history of anaphylaxis to colonoscopy preps (i.e.Miralax)? No previous colonoscopy and not sure if she ever had Miralax.   Any history of dermal filler treatments in the last year? No.   Penicillin - caused itching in the past.   Assessment and Plan: Karen Molina is a 59 y.o. female with: Drug reaction Patient had a reaction to flu vaccine over 10+ years ago and not exactly sure of specifics but apparently had to use a defibrillator and was in the ICU intubated for 2 weeks. This was the first time she had flu vaccine and avoiding  it since then. No issues with eggs, jello. Latex causes rash. Tolerated her childhood vaccines, tetanus vaccines and steroid injections with no issues. No history of Miralax use or dermal fillers. Penicillin caused itching in the past.  Discussed at length the risks and benefits of COVID-19 vaccination. Given her history, I recommend against the J&J vaccine as it contains polysorbate 80 which is an ingredient that most flu vaccines contain.   Okay to receive either Moderna or Coca-Cola vaccine which do not contain the polysorbate 80.   Advised her to get the vaccine at the [facility] where there are medical staff on site if needed versus going to a local pharmacy or church vaccination sites.   Premedicate with zyrtec 10mg  1-2 hours before vaccine.  Wait 30 minutes instead of 15 minutes.  Have someone else drive you there.  Take your epinephrine pen with you.   Option 2 is to get component vaccine testing and get the vaccine in our office in the future. We currently do not have the vaccine available but in the process of obtaining approval.   Vaccine counseling  See assessment and plan as above.   Moderate persistent asthma without complication Diagnosed with asthma over 50 years ago. Currently on Breo 200 1 puff daily and albuterol as needed a few times per week with good benefit. Seen by pulmonology.   Today's spirometry showed:  mild obstructive disease with 14% improvement in FEV1 post bronchodilator treatment and clinically feeling improved.  Daily controller medication(s): continue with Breo 200 1 puff daily and rinse mouth afterwards.   Start Spiriva respimat 1.54mcg 2 puffs once a day. Sample given and demonstrated proper use.   May use albuterol rescue inhaler 2 puffs every 4 to 6 hours as needed for shortness of breath, chest tightness, coughing, and wheezing. May use albuterol rescue inhaler 2 puffs 5 to 15 minutes prior to strenuous physical activities. Monitor frequency of use.    Repeat spirometry at next visit.  Chronic rhinitis Rhinitis symptoms and does not take currently any medications.   Today's skin prick testing was negative to environmental allergies.  Monitor symptoms.   Hymenoptera allergy Apparently needed Epipen after a sting in the past. No work up done.  Get hymenoptera panel with tryptase.  I have prescribed epinephrine injectable and demonstrated proper use. For mild symptoms you can take over the counter antihistamines such as Benadryl and monitor symptoms closely. If symptoms worsen or if you have severe symptoms including breathing issues, throat closure, significant swelling, whole body hives, severe diarrhea and vomiting, lightheadedness then inject epinephrine and seek immediate medical care afterwards.  Avoid stings.  Return in about 2 months (around 11/04/2019).  Meds ordered this encounter  Medications   Tiotropium Bromide Monohydrate (SPIRIVA RESPIMAT) 1.25 MCG/ACT AERS    Sig: Inhale 2 puffs into the lungs daily.    Dispense:  4 g    Refill:  5   EPINEPHrine 0.3 mg/0.3 mL IJ SOAJ injection    Sig: Inject 0.3 mLs (0.3 mg total) into the muscle as needed for anaphylaxis.    Dispense:  1 each    Refill:  2    May dispense generic/Mylan/Teva brand.    Lab Orders     Tryptase     Allergen Hymenoptera Panel     Latex, IgE  Other allergy screening: Asthma: yes  She reports symptoms of chest tightness, shortness of breath, coughing, wheezing for 50+ years. Current medications include Breo 200 1 puff daily and albuterol prn which help. Main triggers are unknown. In the last month, frequency of symptoms: few times/week. Frequency of nocturnal symptoms: 0x/month. Frequency of SABA use: few times week. Interference with physical activity: yes. Sleep is undisturbed. She was evaluated by pulmonologist in the past. Smoking exposure: no.  History of reflux: no.  Rhino conjunctivitis: yes  Some rhinitis symptoms and uses no  medications for this Food allergy: no Medication allergy: yes Hymenoptera allergy: yes - had Epipen after a sting. No work up in the past.  Urticaria: no Eczema:no History of recurrent infections suggestive of immunodeficency: no  Diagnostics: Spirometry:  Tracings reviewed. Her effort: Good reproducible efforts. FVC: 2.45L FEV1: 1.50L, 68% predicted FEV1/FVC ratio: 61% Interpretation: Spirometry consistent with mild obstructive disease with 14% improvement in FEV1 post bronchodilator treatment and clinically feeling improved.  Please see scanned spirometry results for details.  Skin Testing: Environmental allergy panel. Negative test to: environmental allergy panel.  Results discussed with patient/family.  Airborne Adult Perc - 09/04/19 1514    Time Antigen Placed 1514    Allergen Manufacturer Lavella Hammock    Location Back    Number of Test 59    Panel 1 Select    1. Control-Buffer 50% Glycerol Negative    2. Control-Histamine 1 mg/ml 2+    3. Albumin saline Negative    4. Montier Negative    5. [place] Negative    6. Johnson Negative    7. Neabsco Blue Negative    8. Victory Dakin  Fescue Negative    9. Perennial Rye Negative    10. Sweet Vernal Negative    11. Timothy Negative    12. Cocklebur Negative    13. Burweed Marshelder Negative    14. Ragweed, short Negative    15. Ragweed, Giant Negative    16. Plantain,  English Negative    17. Lamb's Quarters Negative    18. Sheep Sorrell Negative    19. Rough Pigweed Negative    20. Marsh Elder, Rough Negative    21. Mugwort, Common Negative    22. Ash mix Negative    23. Birch mix Negative    24. Beech American Negative    25. Box, Elder Negative    26. Cedar, red Negative    27. Cottonwood, [place] Negative    28. Elm mix Negative    29. Hickory Negative    30. Maple mix Negative    31. Oak, [place] mix Negative    32. Pecan Pollen Negative    33. Pine mix Negative    34. Sycamore Eastern Negative    35. [place], Black  Pollen Negative    36. Alternaria alternata Negative    37. Cladosporium Herbarum Negative    38. Aspergillus mix Negative    39. Penicillium mix Negative    40. Bipolaris sorokiniana (Helminthosporium) Negative    41. Drechslera spicifera (Curvularia) Negative    42. Mucor plumbeus Negative    43. Fusarium moniliforme Negative    44. Aureobasidium pullulans (pullulara) Negative    45. Rhizopus oryzae Negative    46. Botrytis cinera Negative    47. Epicoccum nigrum Negative    48. Phoma betae Negative    49. Candida Albicans Negative    50. Trichophyton mentagrophytes Negative    51. Mite, D Farinae  5,000 AU/ml Negative    52. Mite, D Pteronyssinus  5,000 AU/ml Negative    53. Cat Hair 10,000 BAU/ml Negative    54.  Dog Epithelia Negative    55. Mixed Feathers Negative    56. Horse Epithelia Negative    57. Cockroach, German Negative    58. Mouse Negative    59. 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   Chronic right shoulder pain 04/26/2016   Hot flashes, menopausal 02/02/2016   OSA on CPAP 02/02/2016   Extrinsic asthma, unspecified asthma severity, uncomplicated 17/49/4496   Fatigue 10/05/2014   Abnormal EKG 01/12/2011   Hypertension 01/12/2011   Healthcare maintenance 01/12/2011   Past Medical History:  Diagnosis Date   Asthma    Cervical cancer ([place])    Chest pain    Fatigue    H/O: hysterectomy    Insomnia    Palpitations    Paresthesias    lower extremities   Past Surgical History: Past Surgical History:  Procedure Laterality Date   ABDOMINAL HYSTERECTOMY     CERVICAL ABLATION     INGUINAL HERNIA REPAIR     UTERINE FIBROID  SURGERY     Medication List:  Current Outpatient Medications  Medication Sig Dispense Refill   albuterol (VENTOLIN HFA) 108 (90 Base) MCG/ACT inhaler TAKE 2 PUFFS BY MOUTH EVERY 6 HOURS AS NEEDED FOR  WHEEZE OR SHORTNESS OF BREATH 18 g 2   amLODipine (NORVASC) 10 MG tablet TAKE 1 TABLET BY MOUTH EVERY DAY 90 tablet 1   fluticasone furoate-vilanterol (BREO ELLIPTA) 200-25 MCG/INH AEPB Inhale 1 puff into the lungs daily. 1 each 3   Magnesium 250 MG TABS Take by mouth. 1 per day     Multiple Vitamins-Calcium (ONE-A-DAY WOMENS FORMULA) TABS Take 1 tablet by mouth daily.     PARoxetine (PAXIL) 20 MG tablet TAKE 1 TABLET BY MOUTH EVERY DAY 90 tablet 1   Vitamin D, Cholecalciferol, 25 MCG (1000 UT) CAPS Take 1 capsule by mouth daily.     EPINEPHrine 0.3 mg/0.3 mL IJ SOAJ injection Inject 0.3 mLs (0.3 mg total) into the muscle as needed for anaphylaxis. 1 each 2   Ferrous Sulfate (IRON) 325 (65 Fe) MG TABS Take by mouth. 1 per day (Patient not taking: Reported on 09/04/2019)     mometasone-formoterol (DULERA) 100-5 MCG/ACT AERO Inhale 2 puffs into the lungs 2 (two) times daily. (Patient not taking: Reported on 09/04/2019)     Tiotropium Bromide Monohydrate (SPIRIVA RESPIMAT) 1.25 MCG/ACT AERS Inhale 2 puffs into the lungs daily. 4 g 5   No current facility-administered medications for this visit.   Allergies: Allergies  Allergen Reactions   Influenza Virus Vacc Split Pf Other (See Comments)    "deadly sick "   Penicillins Hives, Itching and Swelling   Social History: Social History   Socioeconomic History   Marital status: Single    Spouse name: Not on file   Number of children: 2   Years of education: College   Highest education level: Not on file  Occupational History   Occupation: [employer]   Tobacco Use   Smoking status: Former Smoker    Packs/day: 0.50    Years: 20.00    Pack years: 10.00    Types: Cigarettes    Quit date: 03/23/1996    Years since quitting:  23.4   Smokeless tobacco: Never Used  Vaping Use   Vaping Use: Never used  Substance and Sexual Activity   Alcohol use: Yes    Alcohol/week: 5.0 standard drinks    Types: 4 Cans of beer, 1 Glasses of wine per week    Comment: wine on weekends   Drug use: No    Comment: Quit 03/2003   Sexual activity: Not on file  Other Topics Concern   Not on file  Social History Narrative   Diet: Regular        Do you drink/ eat things with caffeine? No      Marital status:    Single                           What year were you married ?      Do you live in a house, apartment,assistred living, condo, trailer, etc.)? House      Is it one or more stories? 2      How many persons live in your home ? 2      Do you have any pets in your home ?(please list) No      Current or past profession: Quality Auditor       Do you exercise? Yes                             Type & how often: Walk  Do you have a living will? No      Do you have a DNR form?  No                     If not, do you want to discuss one? Yes      Do you have signed POA?HPOA forms?  No               If so, please bring to your        appointment   Denies caffeine use    Social Determinants of Health   Financial Resource Strain: Medium Risk   Difficulty of Paying Living Expenses: Somewhat hard  Food Insecurity: Food Insecurity Present   Worried About Running Out of Food in the Last Year: Sometimes true   Ran Out of Food in the Last Year: Never true  Transportation Needs: No Transportation Needs   Lack of Transportation (Medical): No   Lack of Transportation (Non-Medical): No  Physical Activity:    Days of Exercise per Week:    Minutes of Exercise per Session:   Stress:    Feeling of Stress :   Social Connections:    Frequency of Communication with Friends and Family:    Frequency of Social Gatherings with Friends and Family:    Attends Religious Services:    Active Member of Clubs or  Organizations:    Attends Archivist Meetings:    Marital Status:    Lives in a 60 year old home. Smoking: denies Occupation: facilities operation  Environmental HistoryFreight forwarder in the house: no Charity fundraiser in the family room: yes Carpet in the bedroom: yes Heating: gas Cooling: central Pet: yes 1 dog x 3 yrs  Family History: Family History  Problem Relation Age of Onset   Hypertension Mother    Diabetes Mother    Osteoarthritis Mother    Stroke Maternal Grandmother    Diabetes Maternal Grandmother    Diabetes Brother    Hypertension Brother    HIV/AIDS Brother    Drug abuse Brother    Lupus Sister    Arthritis Sister    Breast cancer Daughter    Asthma Father    Healthy Brother    Healthy Brother    Healthy Son    Cancer Paternal Grandfather    Review of Systems  Constitutional: Negative for appetite change, chills, fever and unexpected weight change.  HENT: Negative for congestion and rhinorrhea.   Eyes: Negative for itching.  Respiratory: Negative for cough, chest tightness, shortness of breath and wheezing.   Cardiovascular: Negative for chest pain.  Gastrointestinal: Negative for abdominal pain.  Genitourinary: Negative for difficulty urinating.  Skin: Negative for rash.  Allergic/Immunologic: Negative for environmental allergies.  Neurological: Negative for headaches.   Objective: BP 128/80 (BP Location: Left Arm, Patient Position: Sitting, Cuff Size: Normal)    Pulse 89    Temp 98.4 F (36.9 C) (Oral)    Resp 16    Ht 5\' 5"  (1.651 m)    Wt 194 lb 12.8 oz (88.4 kg)    SpO2 99%    BMI 32.42 kg/m  Body mass index is 32.42 kg/m. Physical Exam Vitals and nursing note reviewed.  Constitutional:      Appearance: Normal appearance. She is well-developed.  HENT:     Head: Normocephalic and atraumatic.     Right Ear: Tympanic membrane and external ear normal.     Left Ear: Tympanic membrane and external ear  normal.      Nose: Nose normal.     Mouth/Throat:     Mouth: Mucous membranes are moist.     Pharynx: Oropharynx is clear.  Eyes:     Conjunctiva/sclera: Conjunctivae normal.  Cardiovascular:     Rate and Rhythm: Normal rate and regular rhythm.     Heart sounds: Normal heart sounds. No murmur heard.  No friction rub. No gallop.   Pulmonary:     Effort: Pulmonary effort is normal.     Breath sounds: Normal breath sounds. No wheezing, rhonchi or rales.  Abdominal:     Palpations: Abdomen is soft.  Musculoskeletal:     Cervical back: Neck supple.  Skin:    General: Skin is warm.     Findings: No rash.  Neurological:     Mental Status: She is alert and oriented to person, place, and time.  Psychiatric:        Behavior: Behavior normal.    The plan was reviewed with the patient/family, and all questions/concerned were addressed.  It was my pleasure to see Karen Molina today and participate in her care. Please feel free to contact me with any questions or concerns.  Sincerely,  Rexene Alberts, DO Allergy & Immunology  [facility] office: [phone] [facility] office: [place] office: [phone]

## 2019-09-04 NOTE — Patient Instructions (Addendum)
Today's skin testing showed: Negative to indoor/outdoor allergens.  COVID-19 vaccine   Discussed the risks and benefits of COVID-19 vaccination.  Okay to receive either Moderna or Coca-Cola vaccine which do not contain the polysorbate 80.   Do NOT get the J&J vaccine as it contains polysorbate 80 which is also in most flu vaccines.   Advised her to get the vaccine at the [facility] where there are medical staff on site if needed versus going to a local pharmacy or church vaccination sites.   Premedicate with zyrtec 10mg  1-2 hours before vaccine.  Wait 30 minutes instead of 15 minutes.  Have someone else drive you there.  Take your epinephrine pen with you.   Option 2 is to get component vaccine testing and get the vaccine in our office in the future. We currently do not have the vaccine available but in the process of obtaining approval.    Asthma: . Daily controller medication(s): continue with Breo 200 1 puff daily and rinse mouth afterwards.  . Start Spiriva respimat 1.61mcg 2 puffs once a day. Sample given and demonstrated proper use.  . May use albuterol rescue inhaler 2 puffs every 4 to 6 hours as needed for shortness of breath, chest tightness, coughing, and wheezing. May use albuterol rescue inhaler 2 puffs 5 to 15 minutes prior to strenuous physical activities. Monitor frequency of use.  . Asthma control goals:  o Full participation in all desired activities (may need albuterol before activity) o Albuterol use two times or less a week on average (not counting use with activity) o Cough interfering with sleep two times or less a month o Oral steroids no more than once a year o No hospitalizations  Drug allergies  Avoid penicillin and flu vaccine.  Avoid latex   Hymenoptera allergy Get bloodwork:  We are ordering labs, so please allow 1-2 weeks for the results to come back. With the newly implemented Cures Act, the labs might be visible to you at the same time that they  become visible to me. However, I will not address the results until all of the results are back, so please be patient.   I have prescribed epinephrine injectable and demonstrated proper use. For mild symptoms you can take over the counter antihistamines such as Benadryl and monitor symptoms closely. If symptoms worsen or if you have severe symptoms including breathing issues, throat closure, significant swelling, whole body hives, severe diarrhea and vomiting, lightheadedness then inject epinephrine and seek immediate medical care afterwards.  Follow up 2 months or sooner if needed.

## 2019-09-05 ENCOUNTER — Encounter: Payer: Self-pay | Admitting: Allergy

## 2019-09-05 DIAGNOSIS — Z7185 Encounter for immunization safety counseling: Secondary | ICD-10-CM | POA: Insufficient documentation

## 2019-09-05 DIAGNOSIS — T50905A Adverse effect of unspecified drugs, medicaments and biological substances, initial encounter: Secondary | ICD-10-CM | POA: Insufficient documentation

## 2019-09-05 DIAGNOSIS — Z91038 Other insect allergy status: Secondary | ICD-10-CM | POA: Insufficient documentation

## 2019-09-05 DIAGNOSIS — J310 Chronic rhinitis: Secondary | ICD-10-CM | POA: Insufficient documentation

## 2019-09-05 NOTE — Assessment & Plan Note (Addendum)
Patient had a reaction to flu vaccine over 10+ years ago and not exactly sure of specifics but apparently had to use a defibrillator and was in the ICU intubated for 2 weeks. This was the first time she had flu vaccine and avoiding it since then. No issues with eggs, jello. Latex causes rash. Tolerated her childhood vaccines, tetanus vaccines and steroid injections with no issues. No history of Miralax use or dermal fillers. Penicillin caused itching in the past.  Discussed at length the risks and benefits of COVID-19 vaccination. Given her history, I recommend against the J&J vaccine as it contains polysorbate 80 which is an ingredient that most flu vaccines contain.   Okay to receive either Moderna or Coca-Cola vaccine which do not contain the polysorbate 80.   Advised her to get the vaccine at the [facility] where there are medical staff on site if needed versus going to a local pharmacy or church vaccination sites.   Premedicate with zyrtec 10mg  1-2 hours before vaccine.  Wait 30 minutes instead of 15 minutes.  Have someone else drive you there.  Take your epinephrine pen with you.   Option 2 is to get component vaccine testing and get the vaccine in our office in the future. We currently do not have the vaccine available but in the process of obtaining approval.

## 2019-09-05 NOTE — Assessment & Plan Note (Signed)
Rhinitis symptoms and does not take currently any medications.   Today's skin prick testing was negative to environmental allergies.  Monitor symptoms.

## 2019-09-05 NOTE — Assessment & Plan Note (Signed)
Diagnosed with asthma over 50 years ago. Currently on Breo 200 1 puff daily and albuterol as needed a few times per week with good benefit. Seen by pulmonology.   Today's spirometry showed:  mild obstructive disease with 14% improvement in FEV1 post bronchodilator treatment and clinically feeling improved.  . Daily controller medication(s): continue with Breo 200 1 puff daily and rinse mouth afterwards.  . Start Spiriva respimat 1.85mcg 2 puffs once a day. Sample given and demonstrated proper use.  . May use albuterol rescue inhaler 2 puffs every 4 to 6 hours as needed for shortness of breath, chest tightness, coughing, and wheezing. May use albuterol rescue inhaler 2 puffs 5 to 15 minutes prior to strenuous physical activities. Monitor frequency of use.  . Repeat spirometry at next visit.

## 2019-09-05 NOTE — Assessment & Plan Note (Signed)
.   See assessment and plan as above. 

## 2019-09-05 NOTE — Assessment & Plan Note (Signed)
Apparently needed Epipen after a sting in the past. No work up done.  Get hymenoptera panel with tryptase.  I have prescribed epinephrine injectable and demonstrated proper use. For mild symptoms you can take over the counter antihistamines such as Benadryl and monitor symptoms closely. If symptoms worsen or if you have severe symptoms including breathing issues, throat closure, significant swelling, whole body hives, severe diarrhea and vomiting, lightheadedness then inject epinephrine and seek immediate medical care afterwards.  Avoid stings.

## 2019-09-06 ENCOUNTER — Ambulatory Visit: Payer: BC Managed Care – PPO | Admitting: Allergy

## 2019-09-08 LAB — ALLERGEN HYMENOPTERA PANEL
Bumblebee: <0.1 kU/L
Honeybee IgE: <0.1 kU/L
Hornet, White Face, IgE: <0.1 kU/L
Hornet, Yellow, IgE: <0.1 kU/L
Paper Wasp IgE: <0.1 kU/L
Yellow Jacket, IgE: <0.1 kU/L

## 2019-09-08 LAB — TRYPTASE: Tryptase: 6.3 ug/L (ref 2.2–13.2)

## 2019-09-08 LAB — LATEX, IGE: Latex IgE: <0.1 kU/L

## 2019-09-12 ENCOUNTER — Telehealth: Payer: Self-pay

## 2019-10-02 ENCOUNTER — Other Ambulatory Visit: Payer: Self-pay | Admitting: Nurse Practitioner

## 2019-10-02 DIAGNOSIS — I10 Essential (primary) hypertension: Secondary | ICD-10-CM

## 2019-10-09 ENCOUNTER — Other Ambulatory Visit: Payer: Self-pay

## 2019-10-09 ENCOUNTER — Telehealth: Payer: BC Managed Care – PPO

## 2019-10-10 NOTE — Progress Notes (Signed)
This encounter was created in error - please disregard.

## 2019-10-25 ENCOUNTER — Telehealth: Payer: Self-pay

## 2019-11-08 ENCOUNTER — Ambulatory Visit: Payer: BC Managed Care – PPO | Admitting: Allergy

## 2019-11-09 ENCOUNTER — Encounter: Payer: Self-pay | Admitting: Nurse Practitioner

## 2019-11-09 ENCOUNTER — Ambulatory Visit: Payer: BC Managed Care – PPO | Admitting: Nurse Practitioner

## 2019-11-09 ENCOUNTER — Other Ambulatory Visit: Payer: Self-pay

## 2019-11-09 VITALS — BP 140/86 | HR 89 | Temp 98.7°F | Ht 65.0 in | Wt 194.2 lb

## 2019-11-09 DIAGNOSIS — I10 Essential (primary) hypertension: Secondary | ICD-10-CM | POA: Diagnosis not present

## 2019-11-09 DIAGNOSIS — Z113 Encounter for screening for infections with a predominantly sexual mode of transmission: Secondary | ICD-10-CM

## 2019-11-09 DIAGNOSIS — R7303 Prediabetes: Secondary | ICD-10-CM | POA: Diagnosis not present

## 2019-11-09 DIAGNOSIS — N898 Other specified noninflammatory disorders of vagina: Secondary | ICD-10-CM | POA: Diagnosis not present

## 2019-11-09 DIAGNOSIS — Z114 Encounter for screening for human immunodeficiency virus [HIV]: Secondary | ICD-10-CM

## 2019-11-09 LAB — POCT URINALYSIS DIPSTICK
Bilirubin, UA: NEGATIVE
Glucose, UA: NEGATIVE
Ketones, UA: NEGATIVE
Leukocytes, UA: NEGATIVE
Nitrite, UA: NEGATIVE
Protein, UA: POSITIVE — AB
Spec Grav, UA: >=1.03 — AB (ref 1.010–1.025)
Urobilinogen, UA: 0.2 E.U./dL
pH, UA: 6 (ref 5.0–8.0)

## 2019-11-09 MED ORDER — FLUCONAZOLE 100 MG PO TABS: 100.0000 mg | ORAL_TABLET | Freq: Every day | ORAL | 0 refills | Status: DC

## 2019-11-09 NOTE — Patient Instructions (Signed)

## 2019-11-09 NOTE — Progress Notes (Signed)
I,Yamilka Roman [employer] as a Education administrator for [employer], FNP.,have documented all relevant documentation on the behalf of Minette Brine, FNP,as directed by  Minette Brine, FNP while in the presence of Minette Brine, [place]. This visit occurred during the SARS-CoV-2 public health emergency.  Safety protocols were in place, including screening questions prior to the visit, additional usage of staff PPE, and extensive cleaning of exam room while observing appropriate contact time as indicated for disinfecting solutions.  Subjective:     Patient ID: Karen Molina , female    DOB: [birth year] , 59 y.o.   MRN: [id number]   Chief Complaint  Patient presents with   Vaginal Itching    HPI  She has been taking Breo and Spiriva together  Vaginal Itching The patient's primary symptoms include genital itching. The patient's pertinent negatives include no vaginal bleeding or vaginal discharge. This is a new problem. The current episode started in the past 7 days. The problem occurs intermittently. The problem has been unchanged. The patient is experiencing no pain. The problem affects the left side. She is not pregnant. Pertinent negatives include no abdominal pain, dysuria, frequency, headaches, hematuria, rash or vomiting. Exacerbated by: she takes breo daily. She has tried nothing for the symptoms. She is not sexually active. She uses nothing for contraception. She is postmenopausal. There is no history of an abdominal surgery.     Past Medical History:  Diagnosis Date   Asthma    Cervical cancer ([place])    Chest pain    Fatigue    H/O: hysterectomy    Insomnia    Palpitations    Paresthesias    lower extremities     Family History  Problem Relation Age of Onset   Hypertension Mother    Diabetes Mother    Osteoarthritis Mother    Stroke Maternal Grandmother    Diabetes Maternal Grandmother    Diabetes Brother    Hypertension Brother    HIV/AIDS Brother    Drug abuse  Brother    Lupus Sister    Arthritis Sister    Breast cancer Daughter    Asthma Father    Healthy Brother    Healthy Brother    Healthy Son    Cancer Paternal Grandfather      Current Outpatient Medications:    albuterol (VENTOLIN HFA) 108 (90 Base) MCG/ACT inhaler, TAKE 2 PUFFS BY MOUTH EVERY 6 HOURS AS NEEDED FOR WHEEZE OR SHORTNESS OF BREATH, Disp: 18 g, Rfl: 2   amLODipine (NORVASC) 10 MG tablet, TAKE 1 TABLET BY MOUTH EVERY DAY, Disp: 90 tablet, Rfl: 1   EPINEPHrine 0.3 mg/0.3 mL IJ SOAJ injection, Inject 0.3 mLs (0.3 mg total) into the muscle as needed for anaphylaxis., Disp: 1 each, Rfl: 2   fluticasone furoate-vilanterol (BREO ELLIPTA) 200-25 MCG/INH AEPB, Inhale 1 puff into the lungs daily., Disp: 1 each, Rfl: 3   Magnesium 250 MG TABS, Take by mouth. 1 per day, Disp: , Rfl:    PARoxetine (PAXIL) 20 MG tablet, TAKE 1 TABLET BY MOUTH EVERY DAY, Disp: 90 tablet, Rfl: 1   Tiotropium Bromide Monohydrate (SPIRIVA RESPIMAT) 1.25 MCG/ACT AERS, Inhale 2 puffs into the lungs daily., Disp: 4 g, Rfl: 5   Ferrous Sulfate (IRON) 325 (65 Fe) MG TABS, Take by mouth. 1 per day (Patient not taking: Reported on 09/04/2019), Disp: , Rfl:    fluconazole (DIFLUCAN) 100 MG tablet, Take 1 tablet (100 mg total) by mouth daily. Take 1 tablet by mouth now repeat  in 5 days, Disp: 2 tablet, Rfl: 0   Multiple Vitamins-Calcium (ONE-A-DAY WOMENS FORMULA) TABS, Take 1 tablet by mouth daily. (Patient not taking: Reported on 11/08/2019), Disp: , Rfl:    Vitamin D, Cholecalciferol, 25 MCG (1000 UT) CAPS, Take 1 capsule by mouth daily. (Patient not taking: Reported on 11/08/2019), Disp: , Rfl:    Allergies  Allergen Reactions   Influenza Virus Vacc Split Pf Other (See Comments)    "deadly sick "   Penicillins Hives, Itching and Swelling     Review of Systems  Constitutional: Negative.   Respiratory: Negative.   Cardiovascular: Negative.  Negative for chest pain, palpitations and leg  swelling.  Gastrointestinal: Negative for abdominal pain and vomiting.  Genitourinary: Negative for difficulty urinating, dysuria, frequency, genital sores, hematuria, vaginal bleeding, vaginal discharge and vaginal pain.  Skin: Negative for rash.  Neurological: Negative for dizziness and headaches.  Psychiatric/Behavioral: Negative.      Today's Vitals   11/09/19 1412  BP: 140/86  Pulse: 89  Temp: 98.7 F (37.1 C)  TempSrc: Oral  Weight: 194 lb 3.2 oz (88.1 kg)  Height: _0  (1.651 m)  PainSc: 0-No pain   Body mass index is 32.32 kg/m.   Objective:  Physical Exam Constitutional:      General: She is not in acute distress.    Appearance: Normal appearance.  Cardiovascular:     Rate and Rhythm: Normal rate and regular rhythm.     Pulses: Normal pulses.     Heart sounds: Normal heart sounds. No murmur heard.   Pulmonary:     Effort: Pulmonary effort is normal. No respiratory distress.     Breath sounds: Normal breath sounds.  Genitourinary:    Comments: Small tear to vaginal area close to bottom of vagina. Skin:    Capillary Refill: Capillary refill takes less than 2 seconds.  Neurological:     General: No focal deficit present.     Mental Status: She is alert and oriented to person, place, and time.     Cranial Nerves: No cranial nerve deficit.  Psychiatric:        Mood and Affect: Mood normal.        Behavior: Behavior normal.        Thought Content: Thought content normal.        Judgment: Judgment normal.         Assessment And Plan:     1. Vaginal itching  No vaginal discharge present however there is some mild redness to the vaginal wall, also has small area of tearing/excoriation this may have caused the trace blood  Will treat with diflucan based on symptoms  Will check for additional vaginal infections  Trace blood in urine - NuSwab Vaginitis (VG) - POCT Urinalysis Dipstick (81002) - Cervicovaginal ancillary only - Culture, Urine -  fluconazole (DIFLUCAN) 100 MG tablet; Take 1 tablet (100 mg total) by mouth daily. Take 1 tablet by mouth now repeat in 5 days  Dispense: 2 tablet; Refill: 0  2. Prediabetes  Chronic, no current medications - Hemoglobin A1c - BMP8+eGFR  3. Essential hypertension  Chronic, slightly elevated, she feels is related to her rushing to get to her appt.  - BMP8+eGFR  4. Screening for STD (sexually transmitted disease) - Cervicovaginal ancillary only  5. Encounter for HIV (human immunodeficiency virus) test - HIV antibody (with reflex)    Patient was given opportunity to ask questions. Patient verbalized understanding of the plan and was able to repeat key  elements of the plan. All questions were answered to their satisfaction.   Teola Bradley, FNP, have reviewed all documentation for this visit. The documentation on 11/09/19 for the exam, diagnosis, procedures, and orders are all accurate and complete.   THE PATIENT IS ENCOURAGED TO PRACTICE SOCIAL DISTANCING DUE TO THE COVID-19 PANDEMIC.

## 2019-11-09 NOTE — Addendum Note (Signed)
Addended by: Minette Brine F on: 11/09/2019 03:08 PM   Modules accepted: Orders

## 2019-11-10 LAB — BMP8+EGFR
BUN/Creatinine Ratio: 21 (ref 9–23)
BUN: 15 mg/dL (ref 6–24)
CO2: 23 mmol/L (ref 20–29)
Calcium: 9.6 mg/dL (ref 8.7–10.2)
Chloride: 101 mmol/L (ref 96–106)
Creatinine, Ser: 0.71 mg/dL (ref 0.57–1.00)
GFR calc Af Amer: 108 mL/min/{1.73_m2} (ref >59)
GFR calc non Af Amer: 94 mL/min/{1.73_m2} (ref >59)
Glucose: 91 mg/dL (ref 65–99)
Potassium: 4.5 mmol/L (ref 3.5–5.2)
Sodium: 136 mmol/L (ref 134–144)

## 2019-11-10 LAB — HEMOGLOBIN A1C
Est. average glucose Bld gHb Est-mCnc: 143 mg/dL
Hgb A1c MFr Bld: 6.6 % — ABNORMAL HIGH (ref 4.8–5.6)

## 2019-11-10 LAB — HIV ANTIBODY (ROUTINE TESTING W REFLEX): HIV Screen 4th Generation wRfx: NONREACTIVE

## 2019-11-11 LAB — URINE CULTURE

## 2019-11-12 LAB — NUSWAB VAGINITIS (VG)
Candida albicans, NAA: NEGATIVE
Candida glabrata, NAA: NEGATIVE
Trich vag by NAA: NEGATIVE

## 2019-11-14 ENCOUNTER — Telehealth: Payer: Self-pay

## 2019-11-14 NOTE — Telephone Encounter (Signed)
Patient called requesting lab results YL,RMA  

## 2019-11-21 ENCOUNTER — Other Ambulatory Visit: Payer: Self-pay | Admitting: Nurse Practitioner

## 2019-11-21 MED ORDER — METRONIDAZOLE 500 MG PO TABS
500.0000 mg | ORAL_TABLET | Freq: Three times a day (TID) | ORAL | 0 refills | Status: AC
Start: 2019-11-21 — End: 2019-11-28

## 2019-11-21 NOTE — Progress Notes (Signed)
I will send some flagyl to take 3 times a day for 7 days if this does not improve we can refer to GYN.

## 2019-11-29 ENCOUNTER — Ambulatory Visit: Payer: Self-pay

## 2019-11-29 ENCOUNTER — Other Ambulatory Visit: Payer: Self-pay

## 2019-11-29 ENCOUNTER — Telehealth: Payer: BC Managed Care – PPO

## 2019-11-29 DIAGNOSIS — R7303 Prediabetes: Secondary | ICD-10-CM

## 2019-11-29 DIAGNOSIS — I10 Essential (primary) hypertension: Secondary | ICD-10-CM

## 2019-11-29 DIAGNOSIS — Z9989 Dependence on other enabling machines and devices: Secondary | ICD-10-CM

## 2019-12-01 ENCOUNTER — Other Ambulatory Visit: Payer: Self-pay

## 2019-12-01 MED ORDER — ONETOUCH VERIO VI STRP: ORAL_STRIP | 4 refills | Status: DC

## 2019-12-01 MED ORDER — ONETOUCH DELICA LANCETS 33G MISC: 4 refills | Status: DC

## 2019-12-01 MED ORDER — ONETOUCH VERIO REFLECT W/DEVICE KIT: PACK | 1 refills | Status: DC

## 2019-12-06 NOTE — Patient Instructions (Signed)
Visit Information  Goals Addressed      Patient Stated   .  "I would like to get my Asthma under better control" (pt-stated)   On track     Karen Molina (see longitudinal plan of care for additional care plan information)  Current Barriers:  [name] Knowledge Deficits related to disease process and Self Health management of Asthma . Chronic Disease Management support and education needs related to Extrinsic Asthma, OSA on CPAP, HTN, Prediabetes  Nurse Case Manager Clinical Goal(s):  . 11/29/19 New Over the next 180 days, patient will work with the CCM team and Pulmonology  to address needs related to disease education and support to improve Self Health management of Asthma   CCM RN CM Interventions:  08/01/19 call completed with patient (pt requested to keep call brief due to she is at work) . Inter-disciplinary care team collaboration (see longitudinal plan of care) . Evaluation of current treatment plan related to Asthma and patient's adherence to plan as established by provider. . Determined patient completed her Pulmonary evaluation at [facility] on 07/20/19 . Discussed the plan of care as recommended by Dr. Ander Slade:  o Data Reviewed: o No recent chest x-ray or PFT o PFT from 2016 revealed small airway disease with significant bronchodilator response o Assessment:  o History of asthma o Probable obstructive airway disease o Obstructive sleep apnea o -On CPAP therapy o Plan/Recommendations: o Continue Breo o Continue albuterol as needed o Graded exercises o Obtain pulmonary function tests o Encouraged to call with any significant concerns o Follow-up in 3 months . Determined patient experienced an Asthma exacerbation following her 1st COVID vaccine . Determined patient followed an Asthma Action Plan and her symptoms resolved, she denies having Asthmatic symptoms following her 2nd COVID vaccine . Discussed plans with patient for ongoing care management follow up and  provided patient with direct contact information for care management team . Provided printed educational materials related to Asthma mailed to patient for review  Patient Self Care Activities:  . Self administers medications as prescribed . Attends all scheduled provider appointments . Calls pharmacy for medication refills . Calls provider office for new concerns or questions  Please see past updates related to this goal by clicking on the "Past Updates" button in the selected goal      .  "To keep my BP well controlled" (pt-stated)   On track     Current Barriers:  [name] Knowledge Deficits related to disease process and Self Health management of HTN . Chronic Disease Management support and education needs related to HTN, OSA with CPAP usage  Nurse Case Manager Clinical Goal(s):  . 11/29/19 New Over the next 180 days, patient will work with the CCM team to address needs related to disease education and support for HTN.   CCM RN CM Interventions: 11/29/19 call completed with patient   . Evaluation of current treatment plan related to HTN and patient's adherence to plan as established by provider. [name] Re-educated patient re: target BP <130/80; Reviewed and discussed most recent BP during PCP OV, noted elevated BP; Determined patient is Self monitoring her BP and reports BP is within target range . Re-educated on dietary and exercise recommendations including importance to limit Sodium . Reviewed medications with patient and discussed patient is adhering to her prescribed treatment plan, current regimen:  o  amLODipine (NORVASC) 10 MG tablet, TAKE 1 TABLET BY MOUTH EVERY DAY . Advised patient, providing education and rationale, to monitor blood pressure  daily and record, calling the CCM team and or PCP for findings outside established parameters.  . Discussed plans with patient for ongoing care management follow up and provided patient with direct contact information for care management  team  Patient Self Care Activities:  . Self administers medications as prescribed . Attends all scheduled provider appointments . Calls pharmacy for medication refills . Calls provider office for new concerns or questions  Please see past updates related to this goal by clicking on the "Past Updates" button in the selected goal      .  "To lower my A1C" (pt-stated)   On track     Current Barriers:  [name] Knowledge Deficits related to disease process and Self Health management of Diabetes Mellitus . Chronic Disease Management support and education needs related to DMII, HTN, OSA with CPAP usage  Nurse Case Manager Clinical Goal(s):  . 11/29/19 New Over the next 180 days, patient will work with the CCM team to address needs related to disease education and support for DMII  CCM RN CM Interventions:  11/29/19 call completed with patient  . Evaluation of current treatment plan related to Diabetes and patient's adherence to plan as established by provider . Re-educated patient re: target A1c <7.0 %; Determined patient is at target range but A1c is elevated to 6.6 from 6.1%; Re-educated on dietary and exercise recommendations; Re-educated on daily glycemic control, FBS 80-130, <180 after meals . Determined patient is not currently checking CBG's due to not having a glucometer; Discussed having PCP send an Rx for new glucometer, discussed patient will ask her pharmacist for instruction of use or contact the PCP office for face to face instruction on use  . Sent in basket message to PCP provider Arnette Felts FNP requsting a new Rx for glucose monitoring kit  . Provided patient with printed educational materials related to Diabetes Care Schedule; Grocery Shopping with Diabetes; Preventing Complications from Diabetes . Advised patient, providing education and rationale, to check cbg before meals and record, calling the CCM team and or PCP for findings outside established parameters . Discussed plans  with patient for ongoing care management follow up and provided patient with direct contact information for care management team   Patient Self Care Activities:  . Self administers medications as prescribed . Attends all scheduled provider appointments . Calls pharmacy for medication refills . Calls provider office for new concerns or questions  Please see past updates related to this goal by clicking on the "Past Updates" button in the selected goal        Other   .  COMPLETED: To better understanding OSA        Current Barriers:  [name] Knowledge Deficits related to disease process and Self Health Management for OSA with CPAP usage . Chronic Disease Management support and education needs related to OSA, HTN  Nurse Case Manager Clinical Goal(s):  [name] Over the next 90 days, patient will work with the CCM team to address needs related to disease education and support for OSA with CPAP usage.  Goal Met   CCM RN CM Interventions:  11/29/19 call completed with patient  . Evaluation of current treatment plan related to OSA and patient's adherence to plan as established by provider . Determined patient completed an OV with Dr. Wynona Neat on 07/20/19 with the following Assessment/Plan noted:  o Assessment:  o History of asthma o Probable obstructive airway disease o Obstructive sleep apnea o -On CPAP therapy o Plan/Recommendations: o Chemical engineer  o Continue albuterol as needed o Graded exercises o Obtain pulmonary function tests o Encouraged to call with any significant concerns o Follow-up in 3 months . Determined patient is adhering to her prescribed treatment plan and denies having new or worsening symptoms related to her OSA with CPAP use at this time  . Discussed plans with patient for ongoing care management follow up and provided patient with direct contact information for care management team  Patient Self Care Activities:  . Self administers medications as prescribed . Attends all  scheduled provider appointments . Calls pharmacy for medication refills . Calls provider office for new concerns or questions  Please see past updates related to this goal by clicking on the "Past Updates" button in the selected goal        Patient verbalizes understanding of instructions provided today.   Telephone follow up appointment with care management team member scheduled for: 03/18/20  Barb Merino, RN, BSN, CCM Care Management Coordinator [facility]  Direct Phone: [phone]

## 2019-12-06 NOTE — Chronic Care Management (AMB) (Signed)
Care Management   Follow Up Note   11/29/2019 Name: Karen Molina MRN: [id number] DOB: 08-29-1960  Referred by: Minette Brine, FNP Reason for referral : Care Coordination (FU RN CM Call )   Karen Molina is a 59 y.o. year old female who is a primary care patient of Minette Brine, [place]. The CCM team was consulted for assistance with chronic disease management and care coordination needs.    Review of patient status, including review of consultants reports, relevant laboratory and other test results, and collaboration with appropriate care team members and the patient's provider was performed as part of comprehensive patient evaluation and provision of chronic care management services.    SDOH (Social Determinants of Health) assessments performed: Yes - no acute challenges identified at this time See Care Plan activities for detailed interventions related to [place])    Placed outbound CCM RN CM follow up call to patient for a care plan update.   Outpatient Encounter Medications as of 11/29/2019  Medication Sig  . albuterol (VENTOLIN HFA) 108 (90 Base) MCG/ACT inhaler TAKE 2 PUFFS BY MOUTH EVERY 6 HOURS AS NEEDED FOR WHEEZE OR SHORTNESS OF BREATH  . amLODipine (NORVASC) 10 MG tablet TAKE 1 TABLET BY MOUTH EVERY DAY  . EPINEPHrine 0.3 mg/0.3 mL IJ SOAJ injection Inject 0.3 mLs (0.3 mg total) into the muscle as needed for anaphylaxis.  . Ferrous Sulfate (IRON) 325 (65 Fe) MG TABS Take by mouth. 1 per day (Patient not taking: Reported on 09/04/2019)  . fluconazole (DIFLUCAN) 100 MG tablet Take 1 tablet (100 mg total) by mouth daily. Take 1 tablet by mouth now repeat in 5 days  . fluticasone furoate-vilanterol (BREO ELLIPTA) 200-25 MCG/INH AEPB Inhale 1 puff into the lungs daily.  . Magnesium 250 MG TABS Take by mouth. 1 per day  . Multiple Vitamins-Calcium (ONE-A-DAY WOMENS FORMULA) TABS Take 1 tablet by mouth daily. (Patient not taking: Reported on 11/08/2019)  . PARoxetine (PAXIL) 20 MG tablet  TAKE 1 TABLET BY MOUTH EVERY DAY  . Tiotropium Bromide Monohydrate (SPIRIVA RESPIMAT) 1.25 MCG/ACT AERS Inhale 2 puffs into the lungs daily.  . Vitamin D, Cholecalciferol, 25 MCG (1000 UT) CAPS Take 1 capsule by mouth daily. (Patient not taking: Reported on 11/08/2019)   No facility-administered encounter medications on file as of 11/29/2019.     Objective:  Lab Results  Component Value Date   HGBA1C 6.6 (H) 11/09/2019   HGBA1C 6.3 (H) 02/27/2019   Lab Results  Component Value Date   MICROALBUR 10 11/02/2018   LDLCALC 68 04/20/2016   CREATININE 0.71 11/09/2019   BP Readings from Last 3 Encounters:  11/09/19 140/86  09/04/19 128/80  07/20/19 132/72    Goals Addressed      Patient Stated   .  "I would like to get my Asthma under better control" (pt-stated)   On track     [facility] (see longitudinal plan of care for additional care plan information)  Current Barriers:  [name] Knowledge Deficits related to disease process and Self Health management of Asthma . Chronic Disease Management support and education needs related to Extrinsic Asthma, OSA on CPAP, HTN, Prediabetes  Nurse Case Manager Clinical Goal(s):  . 11/29/19 New Over the next 180 days, patient will work with the CCM team and Pulmonology  to address needs related to disease education and support to improve Self Health management of Asthma   CCM RN CM Interventions:  08/01/19 call completed with patient (pt requested to keep  call brief due to she is at work) . Inter-disciplinary care team collaboration (see longitudinal plan of care) . Evaluation of current treatment plan related to Asthma and patient's adherence to plan as established by provider. . Determined patient completed her Pulmonary evaluation at [facility]mon[facility] on 07/20/19 . Discussed the plan of care as recommended by Dr. Wynona Neat:  o Data Reviewed: o No recent chest x-ray or PFT o PFT from 2016 revealed small airway disease with significant  bronchodilator response o Assessment:  o History of asthma o Probable obstructive airway disease o Obstructive sleep apnea o -On CPAP therapy o Plan/Recommendations: o Continue Breo o Continue albuterol as needed o Graded exercises o Obtain pulmonary function tests o Encouraged to call with any significant concerns o Follow-up in 3 months . Determined patient experienced an Asthma exacerbation following her 1st COVID vaccine . Determined patient followed an Asthma Action Plan and her symptoms resolved, she denies having Asthmatic symptoms following her 2nd COVID vaccine . Discussed plans with patient for ongoing care management follow up and provided patient with direct contact information for care management team . Provided printed educational materials related to Asthma mailed to patient for review  Patient Self Care Activities:  . Self administers medications as prescribed . Attends all scheduled provider appointments . Calls pharmacy for medication refills . Calls provider office for new concerns or questions  Please see past updates related to this goal by clicking on the "Past Updates" button in the selected goal      .  "To keep my BP well controlled" (pt-stated)   On track     Current Barriers:  [name] Knowledge Deficits related to disease process and Self Health management of HTN . Chronic Disease Management support and education needs related to HTN, OSA with CPAP usage  Nurse Case Manager Clinical Goal(s):  . 11/29/19 New Over the next 180 days, patient will work with the CCM team to address needs related to disease education and support for HTN.   CCM RN CM Interventions: 11/29/19 call completed with patient   . Evaluation of current treatment plan related to HTN and patient's adherence to plan as established by provider. [name] Re-educated patient re: target BP <130/80; Reviewed and discussed most recent BP during PCP OV, noted elevated BP; Determined patient is Self  monitoring her BP and reports BP is within target range . Re-educated on dietary and exercise recommendations including importance to limit Sodium . Reviewed medications with patient and discussed patient is adhering to her prescribed treatment plan, current regimen:  o  amLODipine (NORVASC) 10 MG tablet, TAKE 1 TABLET BY MOUTH EVERY DAY . Advised patient, providing education and rationale, to monitor blood pressure daily and record, calling the CCM team and or PCP for findings outside established parameters.  . Discussed plans with patient for ongoing care management follow up and provided patient with direct contact information for care management team  Patient Self Care Activities:  . Self administers medications as prescribed . Attends all scheduled provider appointments . Calls pharmacy for medication refills . Calls provider office for new concerns or questions  Please see past updates related to this goal by clicking on the "Past Updates" button in the selected goal      .  "To lower my A1C" (pt-stated)   On track     Current Barriers:  [name] Knowledge Deficits related to disease process and Self Health management of Diabetes Mellitus . Chronic Disease Management support and education needs related to  DMII, HTN, OSA with CPAP usage  Nurse Case Manager Clinical Goal(s):  . 11/29/19 New Over the next 180 days, patient will work with the CCM team to address needs related to disease education and support for DMII  CCM RN CM Interventions:  11/29/19 call completed with patient  . Evaluation of current treatment plan related to Diabetes and patient's adherence to plan as established by provider . Re-educated patient re: target A1c <7.0 %; Determined patient is at target range but A1c is elevated to 6.6 from 6.1%; Re-educated on dietary and exercise recommendations; Re-educated on daily glycemic control, FBS 80-130, <180 after meals . Determined patient is not currently checking CBG's due to  not having a glucometer; Discussed having PCP send an Rx for new glucometer, discussed patient will ask her pharmacist for instruction of use or contact the PCP office for face to face instruction on use  . Sent in basket message to PCP provider Minette Brine FNP requsting a new Rx for glucose monitoring kit  . Provided patient with printed educational materials related to Diabetes Care Schedule; Grocery Shopping with Diabetes; Preventing Complications from Diabetes . Advised patient, providing education and rationale, to check cbg before meals and record, calling the CCM team and or PCP for findings outside established parameters . Discussed plans with patient for ongoing care management follow up and provided patient with direct contact information for care management team   Patient Self Care Activities:  . Self administers medications as prescribed . Attends all scheduled provider appointments . Calls pharmacy for medication refills . Calls provider office for new concerns or questions  Please see past updates related to this goal by clicking on the "Past Updates" button in the selected goal        Other   .  COMPLETED: To better understanding OSA        Current Barriers:  [name] Knowledge Deficits related to disease process and Self Health Management for OSA with CPAP usage . Chronic Disease Management support and education needs related to OSA, HTN  Nurse Case Manager Clinical Goal(s):  [name] Over the next 90 days, patient will work with the CCM team to address needs related to disease education and support for OSA with CPAP usage.  Goal Met   CCM RN CM Interventions:  11/29/19 call completed with patient  . Evaluation of current treatment plan related to OSA and patient's adherence to plan as established by provider . Determined patient completed an OV with Dr. Ander Slade on 07/20/19 with the following Assessment/Plan noted:  o Assessment:  o History of asthma o Probable obstructive airway  disease o Obstructive sleep apnea o -On CPAP therapy o Plan/Recommendations: o Continue Breo o Continue albuterol as needed o Graded exercises o Obtain pulmonary function tests o Encouraged to call with any significant concerns o Follow-up in 3 months . Determined patient is adhering to her prescribed treatment plan and denies having new or worsening symptoms related to her OSA with CPAP use at this time  . Discussed plans with patient for ongoing care management follow up and provided patient with direct contact information for care management team  Patient Self Care Activities:  . Self administers medications as prescribed . Attends all scheduled provider appointments . Calls pharmacy for medication refills . Calls provider office for new concerns or questions  Please see past updates related to this goal by clicking on the "Past Updates" button in the selected goal        Plan:  Telephone follow up appointment with care management team member scheduled for: 03/18/20  Barb Merino, RN, BSN, CCM Care Management Coordinator [facility] [place] [facility]  Direct Phone: [phone]

## 2020-01-04 ENCOUNTER — Ambulatory Visit: Payer: BC Managed Care – PPO

## 2020-01-04 DIAGNOSIS — J45909 Unspecified asthma, uncomplicated: Secondary | ICD-10-CM

## 2020-01-04 DIAGNOSIS — I10 Essential (primary) hypertension: Secondary | ICD-10-CM

## 2020-01-04 NOTE — Patient Instructions (Signed)
Social Worker Visit Information  Goals we discussed today:  Goals Addressed            This Visit's Progress   . Collaborate with RN Care Manager to perform appropriate assessments to assist with care coordination needs       CARE PLAN ENTRY (see longitudinal plan of care for additional care plan information)  Current Barriers:  . Financial constraints related to cost of home repairs . Unknown application process to receive home repair under [facility] program . Chronic conditions including HTN and extrinsic asthma which put patient at increased risk for hospitalization  Social Work Clinical Goal(s):  [name] Over the next 90 days the patient will work with SW to address ongoing care coordination needs  CCM SW Interventions: Completed 01/04/20 . Inter-disciplinary care team collaboration (see longitudinal plan of care) . Successful outbound call placed to the patient to assess for ongoing care coordination needs . Determined the patient has completed the application for home repair assistance under [employer] program o The patient reports she submitted all necessary documents approximately two months ago o The patient reports she has left messages to follow up on application status but has yet to receive a return call . Advised the patient SW would attempt to contact [employer] on her behalf to request an application update . Outbound call placed to [place] message left for Merilyn Baba, [employer] requesting a return call . Scheduled follow up call over the next two weeks  Patient Self Care Activities:  . Patient verbalizes understanding of plan to work with SW to address care coordination needs . Self administers medications as prescribed . Attends all scheduled provider appointments . Performs ADL's independently . Performs IADL's independently . Calls provider office for new  concerns or questions  Initial goal documentation         Follow Up Plan: SW will follow up with patient by phone over the next two weeks.   Daneen Schick, BSW, CDP Social Worker, Certified Dementia Practitioner [place] / [place] [facility] [phone]

## 2020-01-04 NOTE — Chronic Care Management (AMB) (Signed)
Care Management    Social Work Follow Up Note  01/04/2020 Name: Karen Molina MRN: [id number] DOB: [birth year]  Karen Molina is a 59 y.o. year old female who is a primary care patient of Minette Brine, [place]. The CCM team was consulted for assistance with care coordination.   Review of patient status, including review of consultants reports, other relevant assessments, and collaboration with appropriate care team members and the patient's provider was performed as part of comprehensive patient evaluation and provision of chronic care management services.    SDOH (Social Determinants of Health) assessments performed: No    Outpatient Encounter Medications as of 01/04/2020  Medication Sig  . albuterol (VENTOLIN HFA) 108 (90 Base) MCG/ACT inhaler TAKE 2 PUFFS BY MOUTH EVERY 6 HOURS AS NEEDED FOR WHEEZE OR SHORTNESS OF BREATH  . amLODipine (NORVASC) 10 MG tablet TAKE 1 TABLET BY MOUTH EVERY DAY  . Blood Glucose Monitoring Suppl (ONETOUCH VERIO REFLECT) w/Device KIT Use as Directed to check blood sugars two times a day DX:E11.22  . EPINEPHrine 0.3 mg/0.3 mL IJ SOAJ injection Inject 0.3 mLs (0.3 mg total) into the muscle as needed for anaphylaxis.  . Ferrous Sulfate (IRON) 325 (65 Fe) MG TABS Take by mouth. 1 per day (Patient not taking: Reported on 09/04/2019)  . fluconazole (DIFLUCAN) 100 MG tablet Take 1 tablet (100 mg total) by mouth daily. Take 1 tablet by mouth now repeat in 5 days  . fluticasone furoate-vilanterol (BREO ELLIPTA) 200-25 MCG/INH AEPB Inhale 1 puff into the lungs daily.  [name] glucose blood (ONETOUCH VERIO) test strip Use as Directed to check blood sugars two times a day DX:E11.22  . Magnesium 250 MG TABS Take by mouth. 1 per day  . Multiple Vitamins-Calcium (ONE-A-DAY WOMENS FORMULA) TABS Take 1 tablet by mouth daily. (Patient not taking: Reported on 11/08/2019)  . OneTouch Delica Lancets 20B MISC Use as Directed to check blood sugars two times a day DX:E11.22  . PARoxetine  (PAXIL) 20 MG tablet TAKE 1 TABLET BY MOUTH EVERY DAY  . Tiotropium Bromide Monohydrate (SPIRIVA RESPIMAT) 1.25 MCG/ACT AERS Inhale 2 puffs into the lungs daily.  . Vitamin D, Cholecalciferol, 25 MCG (1000 UT) CAPS Take 1 capsule by mouth daily. (Patient not taking: Reported on 11/08/2019)   No facility-administered encounter medications on file as of 01/04/2020.     Goals Addressed            This Visit's Progress   . Collaborate with RN Care Manager to perform appropriate assessments to assist with care coordination needs       CARE PLAN ENTRY (see longitudinal plan of care for additional care plan information)  Current Barriers:  . Financial constraints related to cost of home repairs . Unknown application process to receive home repair under [facility] program . Chronic conditions including HTN and extrinsic asthma which put patient at increased risk for hospitalization  Social Work Clinical Goal(s):  [name] Over the next 90 days the patient will work with SW to address ongoing care coordination needs  CCM SW Interventions: Completed 01/04/20 . Inter-disciplinary care team collaboration (see longitudinal plan of care) . Successful outbound call placed to the patient to assess for ongoing care coordination needs . Determined the patient has completed the application for home repair assistance under [employer] program o The patient reports she submitted all necessary documents approximately two months ago o The patient reports she has left messages to follow up on application status but has yet  to receive a return call . Advised the patient SW would attempt to contact [employer] on her behalf to request an application update . Outbound call placed to [place] message left for Merilyn Baba, [employer] requesting a return call . Scheduled follow up call over the next two weeks  Patient  Self Care Activities:  . Patient verbalizes understanding of plan to work with SW to address care coordination needs . Self administers medications as prescribed . Attends all scheduled provider appointments . Performs ADL's independently . Performs IADL's independently . Calls provider office for new concerns or questions  Initial goal documentation         Follow Up Plan: SW will follow up with patient by phone over the next two weeks.  Daneen Schick, BSW, CDP Social Worker, Certified Dementia Practitioner [place] / [place] [facility] [phone]

## 2020-01-17 ENCOUNTER — Telehealth: Payer: BC Managed Care – PPO

## 2020-01-17 ENCOUNTER — Telehealth: Payer: Self-pay

## 2020-01-17 NOTE — Telephone Encounter (Signed)
  Chronic Care Management   Outreach Note  01/17/2020 Name: Karen Molina MRN: [id number] DOB: [birth year]  Referred by: Minette Brine, FNP Reason for referral : Care Coordination   An unsuccessful telephone outreach was attempted today. The patient was referred to the case management team for assistance with care management and care coordination.   Follow Up Plan: The care management team will reach out to the patient again over the next 21 days.   Daneen Schick, BSW, CDP Social Worker, Certified Dementia Practitioner [place] / [place] [facility] [phone]

## 2020-01-25 ENCOUNTER — Other Ambulatory Visit: Payer: Self-pay | Admitting: Nurse Practitioner

## 2020-01-25 DIAGNOSIS — N951 Menopausal and female climacteric states: Secondary | ICD-10-CM

## 2020-01-25 DIAGNOSIS — R4586 Emotional lability: Secondary | ICD-10-CM

## 2020-01-25 NOTE — Telephone Encounter (Signed)
Refill request Paroxetine HCL 20 MG

## 2020-02-06 ENCOUNTER — Ambulatory Visit: Payer: BC Managed Care – PPO

## 2020-02-06 DIAGNOSIS — J45909 Unspecified asthma, uncomplicated: Secondary | ICD-10-CM

## 2020-02-06 DIAGNOSIS — I10 Essential (primary) hypertension: Secondary | ICD-10-CM

## 2020-02-06 NOTE — Patient Instructions (Signed)
Visit Information  Goals Addressed            This Visit's Progress   . Collaborate with RN Care Manager to perform appropriate assessments to assist with care coordination needs       CARE PLAN ENTRY (see longitudinal plan of care for additional care plan information)  Current Barriers:  . Financial constraints related to cost of home repairs . Unknown application process to receive home repair under [facility] program . Chronic conditions including HTN and extrinsic asthma which put patient at increased risk for hospitalization  Social Work Clinical Goal(s):  [name] Over the next 90 days the patient will work with SW to address ongoing care coordination needs  CCM SW Interventions: Completed 02/06/20 . SW received communication from [place] on 01/05/20 stating the patient was approved for the program and to expect a call from [employer] regarding next steps for home repair o SW attempted outreach call to the patient on 10/27 to provide an update on communication received . SW placed a successful outbound call to the patient on 11/16 . Discussed communication SW received from [name] . Determined the patient has yet to receive contact from [employer] . SW collaboration with [name] via secure e-mail requesting feedback on timeframe patient may be waiting prior to contact from [employer]  Completed 01/04/20 . Inter-disciplinary care team collaboration (see longitudinal plan of care) . Successful outbound call placed to the patient to assess for ongoing care coordination needs . Determined the patient has completed the application for home repair assistance under [employer] program o The patient reports she submitted all necessary documents approximately two months ago o The patient reports she has left messages to follow up on application status but has yet to receive a return call . Advised the patient SW would attempt to contact [employer] on her behalf to request an application update . Outbound call placed to [place] message left for Merilyn Baba, [employer] requesting a return call . Scheduled follow up call over the next two weeks  Patient Self Care Activities:  . Patient verbalizes understanding of plan to work with SW to address care coordination needs . Self administers medications as prescribed . Attends all scheduled provider appointments . Performs ADL's independently . Performs IADL's independently . Calls provider office for new concerns or questions  Please see past updates related to this goal by clicking on the "Past Updates" button in the selected goal         The care management team will reach out to the patient again over the next 21 days.   Daneen Schick, BSW, CDP Social Worker, Certified Dementia Practitioner [place] / [place] [facility] [phone]

## 2020-02-06 NOTE — Chronic Care Management (AMB) (Signed)
Care Management   Follow Up Note   02/06/2020 Name: Karen Molina MRN: [id number] DOB: 1961-02-22  Karen Molina is enrolled in a Managed Medicaid plan: No. Outreach attempt today was successful.    Referred by: Karen Brine, FNP Reason for referral : [name] is a 59 y.o. year old female who is a primary care patient of Karen Molina, [place]. The care management team was consulted for assistance with care management and care coordination needs.    Review of patient status, including review of consultants reports, relevant laboratory and other test results, and collaboration with appropriate care team members and the patient's provider was performed as part of comprehensive patient evaluation and provision of chronic care management services.    Goals Addressed            This Visit's Progress    Collaborate with RN Care Manager to perform appropriate assessments to assist with care coordination needs       CARE PLAN ENTRY (see longitudinal plan of care for additional care plan information)  Current Barriers:   Financial constraints related to cost of home repairs  Unknown application process to receive home repair under Karen Molina  Chronic conditions including HTN and extrinsic asthma which put patient at increased risk for hospitalization  Social Work Clinical Goal(s):   Over the next 90 days the patient will work with SW to address ongoing care coordination needs  CCM SW Interventions: Completed 02/06/20  SW received communication from Karen Molina on 01/05/20 stating the patient was approved for the Molina and to expect a call from Karen Molina regarding next steps for home repair o SW attempted outreach call to the patient on 10/27 to provide an update on communication received  SW placed a successful outbound call to the patient on 11/16  Discussed communication SW received from Karen Molina  Determined the  patient has yet to receive contact from Karen Molina with Karen Molina via secure e-mail requesting feedback on timeframe patient may be waiting prior to contact from Karen Molina  Completed 01/04/20  Inter-disciplinary care team collaboration (see longitudinal plan of care)  Successful outbound call placed to the patient to assess for ongoing care coordination needs  Determined the patient has completed the application for home repair assistance under Karen Molina Molina o The patient reports she submitted all necessary documents approximately two months ago o The patient reports she has left messages to follow up on application status but has yet to receive a return call  Advised the patient SW would attempt to contact Karen Molina on her behalf to request an application update  Outbound call placed to Karen Molina message left for Karen Molina, Karen Molina requesting a return call  Scheduled follow up call over the next two weeks  Patient Self Care Activities:   Patient verbalizes understanding of plan to work with SW to address care coordination needs  Self administers medications as prescribed  Attends all scheduled provider appointments  Performs ADL's independently  Performs IADL's independently  Calls provider office for new concerns or questions  Please see past updates related to this goal by clicking on the "Past Updates" button in the selected goal          The care management team will reach out to the patient again over the next 21 days.   Daneen Schick, BSW, CDP Social Worker, Transport planner [facility]  Care Management [phone]

## 2020-02-26 ENCOUNTER — Ambulatory Visit: Payer: BC Managed Care – PPO

## 2020-02-26 DIAGNOSIS — I10 Essential (primary) hypertension: Secondary | ICD-10-CM

## 2020-02-26 DIAGNOSIS — J45909 Unspecified asthma, uncomplicated: Secondary | ICD-10-CM

## 2020-02-26 NOTE — Chronic Care Management (AMB) (Signed)
Care Management   Follow Up Note   02/26/2020 Name: MAGHEN GROUP MRN: [id number] DOB: 01/29/1961  [name] is enrolled in a Managed Medicaid plan: No. Outreach attempt today was successful.    Referred by: Minette Brine, FNP Reason for referral : [place] is a 59 y.o. year old female who is a primary care patient of Minette Brine, [place]. The care management team was consulted for assistance with care management and care coordination needs.    Review of patient status, including review of consultants reports, relevant laboratory and other test results, and collaboration with appropriate care team members and the patient's provider was performed as part of comprehensive patient evaluation and provision of chronic care management services.    Goals Addressed            This Visit's Progress   . Collaborate with RN Care Manager to perform appropriate assessments to assist with care coordination needs       CARE PLAN ENTRY (see longitudinal plan of care for additional care plan information)  Current Barriers:  . Financial constraints related to cost of home repairs . Unknown application process to receive home repair under [facility] program . Chronic conditions including HTN and extrinsic asthma which put patient at increased risk for hospitalization  Social Work Clinical Goal(s):  [name] Over the next 90 days the patient will work with SW to address ongoing care coordination needs  CCM SW Interventions: Completed 02/26/20 . Communication received from [name] stating: "I checked with the lead testing firm [employer]. They should be in touch with her soon. I don't have an approximate date but I would say from how the list is going there are four ahead of her property and the testing firm has been moving pretty fast. When her application was approved, there were 32 ahead of her. I project it to be completed by next week. Thank you for  your time and patience." . Successful outbound call placed to the patient to share the above information . Encouraged the patient to contact SW as needed for assistance with care coordination . Scheduled follow up call over the next month  Completed 02/06/20 . SW received communication from [place] on 01/05/20 stating the patient was approved for the program and to expect a call from [employer] regarding next steps for home repair o SW attempted outreach call to the patient on 10/27 to provide an update on communication received . SW placed a successful outbound call to the patient on 11/16 . Discussed communication SW received from [name] . Determined the patient has yet to receive contact from [employer] . SW collaboration with [name] via secure e-mail requesting feedback on timeframe patient may be waiting prior to contact from [employer]  Completed 01/04/20 . Inter-disciplinary care team collaboration (see longitudinal plan of care) . Successful outbound call placed to the patient to assess for ongoing care coordination needs . Determined the patient has completed the application for home repair assistance under [employer] program o The patient reports she submitted all necessary documents approximately two months ago o The patient reports she has left messages to follow up on application status but has yet to receive a return call . Advised the patient SW would attempt to contact [employer] on her behalf to request an application update . Outbound call placed to [place] message left for [employer], [employer] requesting a  return call . Scheduled follow up call over the next two weeks  Patient Self Care Activities:  . Patient verbalizes understanding of plan to work with SW to address care coordination needs . Self administers medications as prescribed . Attends all scheduled provider  appointments . Performs ADL's independently . Performs IADL's independently . Calls provider office for new concerns or questions  Please see past updates related to this goal by clicking on the "Past Updates" button in the selected goal          The care management team will reach out to the patient again over the next 45 days.   Daneen Schick, BSW, CDP Social Worker, Certified Dementia Practitioner [place] / [place] [facility] [phone]

## 2020-02-26 NOTE — Patient Instructions (Signed)
Visit Information  Goals Addressed            This Visit's Progress   . Collaborate with RN Care Manager to perform appropriate assessments to assist with care coordination needs       CARE PLAN ENTRY (see longitudinal plan of care for additional care plan information)  Current Barriers:  . Financial constraints related to cost of home repairs . Unknown application process to receive home repair under [facility] program . Chronic conditions including HTN and extrinsic asthma which put patient at increased risk for hospitalization  Social Work Clinical Goal(s):  [name] Over the next 90 days the patient will work with SW to address ongoing care coordination needs  CCM SW Interventions: Completed 02/26/20 . Communication received from [name] stating: "I checked with the lead testing firm [employer]. They should be in touch with her soon. I don't have an approximate date but I would say from how the list is going there are four ahead of her property and the testing firm has been moving pretty fast. When her application was approved, there were 32 ahead of her. I project it to be completed by next week. Thank you for your time and patience." . Successful outbound call placed to the patient to share the above information . Encouraged the patient to contact SW as needed for assistance with care coordination . Scheduled follow up call over the next month  Completed 02/06/20 . SW received communication from [facility] on 01/05/20 stating the patient was approved for the program and to expect a call from [employer] regarding next steps for home repair o SW attempted outreach call to the patient on 10/27 to provide an update on communication received . SW placed a successful outbound call to the patient on 11/16 . Discussed communication SW received from [name] . Determined the patient has yet to receive contact from [employer] . SW collaboration with [name] via secure  e-mail requesting feedback on timeframe patient may be waiting prior to contact from [employer]  Completed 01/04/20 . Inter-disciplinary care team collaboration (see longitudinal plan of care) . Successful outbound call placed to the patient to assess for ongoing care coordination needs . Determined the patient has completed the application for home repair assistance under [employer] program o The patient reports she submitted all necessary documents approximately two months ago o The patient reports she has left messages to follow up on application status but has yet to receive a return call . Advised the patient SW would attempt to contact [employer] on her behalf to request an application update . Outbound call placed to [place] message left for Merilyn Baba, [employer] requesting a return call . Scheduled follow up call over the next two weeks  Patient Self Care Activities:  . Patient verbalizes understanding of plan to work with SW to address care coordination needs . Self administers medications as prescribed . Attends all scheduled provider appointments . Performs ADL's independently . Performs IADL's independently . Calls provider office for new concerns or questions  Please see past updates related to this goal by clicking on the "Past Updates" button in the selected goal         The care management team will reach out to the patient again over the next 45 days.   Daneen Schick, BSW, CDP Social Worker, Certified Dementia Practitioner [place] / [place] [facility] [phone]

## 2020-03-14 ENCOUNTER — Ambulatory Visit: Payer: BC Managed Care – PPO | Admitting: Nurse Practitioner

## 2020-03-18 ENCOUNTER — Telehealth: Payer: BC Managed Care – PPO

## 2020-03-26 ENCOUNTER — Telehealth: Payer: BC Managed Care – PPO

## 2020-03-26 ENCOUNTER — Telehealth: Payer: Self-pay

## 2020-03-26 NOTE — Telephone Encounter (Cosign Needed)
  Chronic Care Management   Outreach Note  03/26/2020 Name: Karen Molina MRN: [id number] DOB: 02/23/1961  Referred by: Arnette Felts, FNP Reason for referral : Chronic Care Management (RN CM FU Call )   Deborha Payment is enrolled in a Managed Medicaid Health Plan: No  An unsuccessful telephone outreach was attempted today. The patient was referred to the case management team for assistance with care management and care coordination.   Follow Up Plan: Telephone follow up appointment with care management team member scheduled for: 04/26/20  Delsa Sale, RN, BSN, CCM Care Management Coordinator [facility] Care Management/[facility]  Direct Phone: [phone]

## 2020-04-04 ENCOUNTER — Telehealth: Payer: BC Managed Care – PPO

## 2020-04-04 ENCOUNTER — Telehealth: Payer: Self-pay

## 2020-04-04 NOTE — Telephone Encounter (Signed)
  Chronic Care Management   Outreach Note  04/04/2020 Name: JENIFER STRUVE MRN: [id number] DOB: 1960/07/12  Referred by: Minette Brine, FNP Reason for referral : Care Coordination   An unsuccessful telephone outreach was attempted today. The patient was referred to the case management team for assistance with care management and care coordination.   Follow Up Plan: A HIPAA compliant phone message was left for the patient providing contact information and requesting a return call.  The care management team will reach out to the patient again over the next 30 days.   Daneen Schick, BSW, CDP Social Worker, Certified Dementia Practitioner [place] / [place] Management [phone]

## 2020-04-26 ENCOUNTER — Telehealth: Payer: Self-pay

## 2020-04-26 NOTE — Telephone Encounter (Cosign Needed)
   04/26/2020  Karen Molina March 26, 1960 [id number]   An unsuccessful telephone outreach was attempted today. I spoke with [name] briefly, unfortunately she is unable to speak with me at this time but is agreeable to a call back. The patient was referred to the case management team for assistance with care management and care coordination.   Follow Up Plan: Telephone follow up appointment with care management team member scheduled for: 04/30/20  Barb Merino, RN, BSN, CCM Care Management Coordinator [place] Management/[facility]  Direct Phone: [phone]

## 2020-04-30 ENCOUNTER — Telehealth: Payer: Self-pay

## 2020-04-30 NOTE — Telephone Encounter (Cosign Needed)
   04/30/2020  Karen Molina March 21, 1961 [id number]   A second unsuccessful telephone outreach was attempted today. The patient was referred to the case management team for assistance with care management and care coordination.   Follow Up Plan: Telephone follow up appointment with care management team member scheduled for: 06/03/20  Barb Merino, RN, BSN, CCM Care Management Coordinator [place] [facility]  Direct Phone: [phone]

## 2020-05-01 ENCOUNTER — Ambulatory Visit: Payer: Self-pay

## 2020-05-01 DIAGNOSIS — I10 Essential (primary) hypertension: Secondary | ICD-10-CM

## 2020-05-01 DIAGNOSIS — J45909 Unspecified asthma, uncomplicated: Secondary | ICD-10-CM

## 2020-05-01 NOTE — Patient Instructions (Signed)
Goals we discussed today:  Goals Addressed    . Work with SW to manage care coordination needs       Timeframe:  Long-Range Goal Priority:  Low Start Date:  2.9.22                           Expected End Date: 6.9.22                      Next planned outreach: 2.25.22  Patient Goals/Self-Care Activities . Over the next 14 days, patient will:   - Patient will self administer medications as prescribed Patient will attend all scheduled provider appointments Patient will call provider office for new concerns or questions Contact SW as needed prior to next scheduled call

## 2020-05-01 NOTE — Chronic Care Management (AMB) (Signed)
Social Work Note  05/01/2020 Name: Karen Molina MRN: [id number] DOB: 21-Feb-1961  Karen Molina is a 61 y.o. year old female who is a primary care patient of Karen Molina, Karen Molina.  The Care Management team was consulted for assistance with chronic disease management and care coordination needs.  Karen Molina was given information about Care Management services today including:  1. Care Management services include personalized support from designated clinical staff supervised by her physician, including individualized plan of care and coordination with other care providers 2. 24/7 contact phone numbers for assistance for urgent and routine care needs. 3. The patient may stop care management services at any time (effective at the end of the month) by phone call to the office staff.  Patient agreed to services and consent obtained.   Engaged with patient by telephone for follow up visit in response to provider referral for social work chronic care management and care coordination services.  Assessment: Review of patient past medical history, allergies, medications, and health status, including review of pertinent consultant reports was performed as part of comprehensive evaluation and provision of care management/care coordination services.   SDOH (Social Determinants of Health) assessments and interventions performed:  No    Advanced Directives Status: Not addressed in this encounter.  Care Plan  Allergies  Allergen Reactions  . Influenza Virus Vacc Split Pf Other (See Comments)    "deadly sick "  . Penicillins Hives, Itching and Swelling    Outpatient Encounter Medications as of 05/01/2020  Medication Sig  . albuterol (VENTOLIN HFA) 108 (90 Base) MCG/ACT inhaler TAKE 2 PUFFS BY MOUTH EVERY 6 HOURS AS NEEDED FOR WHEEZE OR SHORTNESS OF BREATH  . amLODipine (NORVASC) 10 MG tablet TAKE 1 TABLET BY MOUTH EVERY DAY  . Blood Glucose Monitoring Suppl (ONETOUCH VERIO REFLECT) w/Device KIT Use  as Directed to check blood sugars two times a day DX:E11.22  . EPINEPHrine 0.3 mg/0.3 mL IJ SOAJ injection Inject 0.3 mLs (0.3 mg total) into the muscle as needed for anaphylaxis.  . Ferrous Sulfate (IRON) 325 (65 Fe) MG TABS Take by mouth. 1 per day (Patient not taking: Reported on 09/04/2019)  . fluconazole (DIFLUCAN) 100 MG tablet Take 1 tablet (100 mg total) by mouth daily. Take 1 tablet by mouth now repeat in 5 days  . fluticasone furoate-vilanterol (BREO ELLIPTA) 200-25 MCG/INH AEPB Inhale 1 puff into the lungs daily.  [name] glucose blood (ONETOUCH VERIO) test strip Use as Directed to check blood sugars two times a day DX:E11.22  . Magnesium 250 MG TABS Take by mouth. 1 per day  . Multiple Vitamins-Calcium (ONE-A-DAY WOMENS FORMULA) TABS Take 1 tablet by mouth daily. (Patient not taking: Reported on 11/08/2019)  . OneTouch Delica Lancets 35T MISC Use as Directed to check blood sugars two times a day DX:E11.22  . PARoxetine (PAXIL) 20 MG tablet TAKE 1 TABLET BY MOUTH EVERY DAY  . Tiotropium Bromide Monohydrate (SPIRIVA RESPIMAT) 1.25 MCG/ACT AERS Inhale 2 puffs into the lungs daily.  . Vitamin D, Cholecalciferol, 25 MCG (1000 UT) CAPS Take 1 capsule by mouth daily. (Patient not taking: Reported on 11/08/2019)   No facility-administered encounter medications on file as of 05/01/2020.    Patient Active Problem List   Diagnosis Date Noted  . Drug reaction 09/05/2019  . Vaccine counseling 09/05/2019  . 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 Chronic rhinitis 09/05/2019  . Moderate persistent asthma without complication 01/77/9390  . 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 Chronic  right shoulder pain 04/26/2016  . Hot flashes, menopausal 02/02/2016  . OSA on CPAP 02/02/2016  . Extrinsic asthma, unspecified asthma severity, uncomplicated 41/66/0630  . Fatigue 10/05/2014  . Abnormal EKG 01/12/2011  . Hypertension 01/12/2011  . Healthcare maintenance 01/12/2011    Conditions to be  addressed/monitored: HTN and Asthma; Financial constraints related to home repair  Care Plan : Social Work [facility] Care Plan  Updates made by Karen Molina since 05/01/2020 12:00 AM    Problem: Barriers to Treatment     Long-Range Goal: Collaborate with RN Care Manager to assist with care coordination needs related to barriers to treatment   Start Date: 05/01/2020  Expected End Date: 08/29/2020  Priority: Low  Note:   Current Barriers:  . Chronic disease management support and education needs related to HTN and Asthma   . Financial constraints related to cost of home repair  Social Worker Clinical Goal(s):  [name] Over the next 120 days, patient will work with SW to identify and address any acute and/or chronic care coordination needs related to the self health management of HTN and Asthma   . Over the next 120 days, patient will work with SW to address concerns related to home repair needs CCM SW Interventions:  . Inter-disciplinary care team collaboration (see longitudinal plan of care) . Collaboration with Karen Brine, FNP regarding development and update of comprehensive plan of care as evidenced by provider attestation and co-signature . Successful outbound call placed to the patient to assess goal progression . Determined the patient did receive a visit to her home from the [facility] "sometime in November or December" - patient unaware of next steps in home repair process . SW collaboration with Karen Molina to request feedback on next steps in application process . Scheduled follow up call over the next 14 days Patient Goals/Self-Care Activities . Over the next 14 days, patient will:   - Patient will self administer medications as prescribed Patient will attend all scheduled provider appointments Patient will call provider office for new concerns or questions Contact SW as needed prior to next scheduled call Follow Up Plan:  SW will follow up with the  patient over the next 14 days     Follow Up Plan: SW will follow up with patient by phone over the next 14 days      Karen Molina, BSW, CDP Social Worker, Certified Dementia Practitioner [place] / [place] Management [phone]

## 2020-05-05 ENCOUNTER — Other Ambulatory Visit: Payer: Self-pay | Admitting: Nurse Practitioner

## 2020-05-05 DIAGNOSIS — I10 Essential (primary) hypertension: Secondary | ICD-10-CM

## 2020-05-10 ENCOUNTER — Other Ambulatory Visit: Payer: Self-pay | Admitting: Family Medicine

## 2020-05-10 DIAGNOSIS — Z1231 Encounter for screening mammogram for malignant neoplasm of breast: Secondary | ICD-10-CM

## 2020-05-17 ENCOUNTER — Ambulatory Visit: Payer: Self-pay

## 2020-05-17 DIAGNOSIS — J45909 Unspecified asthma, uncomplicated: Secondary | ICD-10-CM

## 2020-05-17 DIAGNOSIS — I10 Essential (primary) hypertension: Secondary | ICD-10-CM

## 2020-05-17 NOTE — Chronic Care Management (AMB) (Signed)
Social Work Note  05/17/2020 Name: MOON BUDDE MRN: [id number] DOB: 1961-03-04  [name] is a 60 y.o. year old female who is a primary care patient of Minette Brine, [place].  The Care Management team was consulted for assistance with chronic disease management and care coordination needs.  Ms. Hendricks was given information about Care Management services today including:  1. Care Management services include personalized support from designated clinical staff supervised by her physician, including individualized plan of care and coordination with other care providers 2. 24/7 contact phone numbers for assistance for urgent and routine care needs. 3. The patient may stop care management services at any time (effective at the end of the month) by phone call to the office staff.  Patient agreed to services and consent obtained.   Engaged with patient by telephone for follow up visit in response to provider referral for social work chronic care management and care coordination services.  Assessment: Review of patient past medical history, allergies, medications, and health status, including review of pertinent consultant reports was performed as part of comprehensive evaluation and provision of care management/care coordination services.   SDOH (Social Determinants of Health) assessments and interventions performed:  No    Advanced Directives Status: Not addressed in this encounter.  Care Plan  Allergies  Allergen Reactions  . Influenza Virus Vacc Split Pf Other (See Comments)    "deadly sick "  . Penicillins Hives, Itching and Swelling    Outpatient Encounter Medications as of 05/17/2020  Medication Sig  . albuterol (VENTOLIN HFA) 108 (90 Base) MCG/ACT inhaler TAKE 2 PUFFS BY MOUTH EVERY 6 HOURS AS NEEDED FOR WHEEZE OR SHORTNESS OF BREATH  . amLODipine (NORVASC) 10 MG tablet TAKE 1 TABLET BY MOUTH EVERY DAY  . Blood Glucose Monitoring Suppl (ONETOUCH VERIO REFLECT) w/Device KIT Use  as Directed to check blood sugars two times a day DX:E11.22  . EPINEPHrine 0.3 mg/0.3 mL IJ SOAJ injection Inject 0.3 mLs (0.3 mg total) into the muscle as needed for anaphylaxis.  . Ferrous Sulfate (IRON) 325 (65 Fe) MG TABS Take by mouth. 1 per day (Patient not taking: Reported on 09/04/2019)  . fluconazole (DIFLUCAN) 100 MG tablet Take 1 tablet (100 mg total) by mouth daily. Take 1 tablet by mouth now repeat in 5 days  . fluticasone furoate-vilanterol (BREO ELLIPTA) 200-25 MCG/INH AEPB Inhale 1 puff into the lungs daily.  [name] glucose blood (ONETOUCH VERIO) test strip Use as Directed to check blood sugars two times a day DX:E11.22  . Magnesium 250 MG TABS Take by mouth. 1 per day  . Multiple Vitamins-Calcium (ONE-A-DAY WOMENS FORMULA) TABS Take 1 tablet by mouth daily. (Patient not taking: Reported on 11/08/2019)  . OneTouch Delica Lancets 42A MISC Use as Directed to check blood sugars two times a day DX:E11.22  . PARoxetine (PAXIL) 20 MG tablet TAKE 1 TABLET BY MOUTH EVERY DAY  . Tiotropium Bromide Monohydrate (SPIRIVA RESPIMAT) 1.25 MCG/ACT AERS Inhale 2 puffs into the lungs daily.  . Vitamin D, Cholecalciferol, 25 MCG (1000 UT) CAPS Take 1 capsule by mouth daily. (Patient not taking: Reported on 11/08/2019)   No facility-administered encounter medications on file as of 05/17/2020.    Patient Active Problem List   Diagnosis Date Noted  . Drug reaction 09/05/2019  . Vaccine counseling 09/05/2019  . 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 Chronic rhinitis 09/05/2019  . Moderate persistent asthma without complication 76/81/1572  . 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 Chronic  right shoulder pain 04/26/2016  . Hot flashes, menopausal 02/02/2016  . OSA on CPAP 02/02/2016  . Extrinsic asthma, unspecified asthma severity, uncomplicated 53/97/6734  . Fatigue 10/05/2014  . Abnormal EKG 01/12/2011  . Hypertension 01/12/2011  . Healthcare maintenance 01/12/2011    Conditions to be  addressed/monitored: HTN and Asthma; Housing barriers  Care Plan : Social Work [place]  Updates made by Daneen Schick since 05/17/2020 12:00 AM    Problem: Barriers to Treatment     Long-Range Goal: Collaborate with RN Care Manager to assist with care coordination needs related to barriers to treatment   Start Date: 05/01/2020  Expected End Date: 08/29/2020  Priority: Low  Note:   Current Barriers:  . Chronic disease management support and education needs related to HTN and Asthma   . Financial constraints related to cost of home repair  Social Worker Clinical Goal(s):  [name] Over the next 120 days, patient will work with SW to identify and address any acute and/or chronic care coordination needs related to the self health management of HTN and Asthma   . Over the next 120 days, patient will work with SW to address concerns related to home repair needs CCM SW Interventions:  . Inter-disciplinary care team collaboration (see longitudinal plan of care) . Collaboration with Minette Brine, FNP regarding development and update of comprehensive plan of care as evidenced by provider attestation and co-signature . Successful outbound call placed to the patient to assess goal progression . Discussed SW has yet to get a return email from [facility] regarding status of repairs to the patients home . Determined the patient does not have a contact number for the person she has been working with but is able to go to the office in person to request feedback on repair scheduled . Patient reports she will go to the office either Monday or Tuesday to follow up on repair status . Scheduled follow up call over the next 21 days Patient Goals/Self-Care Activities . Over the next 14 days, patient will:   - Patient will self administer medications as prescribed Patient will attend all scheduled provider appointments Patient will call provider office for new concerns or questions Contact SW as needed prior  to next scheduled call Follow up on home repair schedule Follow Up Plan:  SW will follow up with the patient over the next 21 days       Follow Up Plan: SW will follow up with patient by phone over the next 21 days.      Daneen Schick, BSW, CDP Social Worker, Certified Dementia Practitioner [place] [facility] [place] [facility] [phone]

## 2020-05-17 NOTE — Patient Instructions (Signed)
   Goals we discussed today:  Goals Addressed            This Visit's Progress   . Work with SW to manage care coordination needs       Timeframe:  Long-Range Goal Priority:  Low Start Date:  2.9.22                           Expected End Date: 6.9.22                      Next planned outreach: 3.15.22  Patient Goals/Self-Care Activities . Over the next 14 days, patient will:   - Patient will self administer medications as prescribed Patient will attend all scheduled provider appointments Patient will call provider office for new concerns or questions Contact SW as needed prior to next scheduled call Follow up on home repair schedule

## 2020-05-28 ENCOUNTER — Encounter: Payer: Self-pay | Admitting: Gastroenterology

## 2020-06-03 ENCOUNTER — Telehealth: Payer: BC Managed Care – PPO

## 2020-06-03 ENCOUNTER — Ambulatory Visit: Payer: Self-pay

## 2020-06-03 DIAGNOSIS — R7303 Prediabetes: Secondary | ICD-10-CM

## 2020-06-03 DIAGNOSIS — I10 Essential (primary) hypertension: Secondary | ICD-10-CM

## 2020-06-03 DIAGNOSIS — J45909 Unspecified asthma, uncomplicated: Secondary | ICD-10-CM

## 2020-06-03 DIAGNOSIS — G4733 Obstructive sleep apnea (adult) (pediatric): Secondary | ICD-10-CM

## 2020-06-04 ENCOUNTER — Ambulatory Visit: Payer: BC Managed Care – PPO

## 2020-06-04 DIAGNOSIS — J45909 Unspecified asthma, uncomplicated: Secondary | ICD-10-CM

## 2020-06-04 DIAGNOSIS — I10 Essential (primary) hypertension: Secondary | ICD-10-CM

## 2020-06-04 NOTE — Chronic Care Management (AMB) (Signed)
Social Work Note  06/04/2020 Name: Karen Molina MRN: [id number] DOB: Oct 25, 1960  Karen Molina is a 60 y.o. year old female who is a primary care patient of Minette Brine, [place].  The Care Management team was consulted for assistance with chronic disease management and care coordination needs.  Karen Molina was given information about Care Management services today including:  1. Care Management services include personalized support from designated clinical staff supervised by her physician, including individualized plan of care and coordination with other care providers 2. 24/7 contact phone numbers for assistance for urgent and routine care needs. 3. The patient may stop care management services at any time (effective at the end of the month) by phone call to the office staff.  Patient agreed to services and consent obtained.   Engaged with patient by telephone for follow up visit in response to provider referral for social work chronic care management and care coordination services.  Follow up call placed to the patient to assist with care coordination needs. The patient reports she is actively engaged with the [place] who has identified lead in her home and does plan to assist with home modifications to remove the lead. Patient also reports a home inspection was performed yesterday by Mr. Eliberto Ivory with the [employer] who has identified other needs in the home in which the city will repair. The patient was very appreciative of SW involvement with linking to services.  Discussed the patient has informed RN Care Manager of her decision to switch to a new primary care provider. The patient is aware she will be discharged from Care Management services at this time.  Assessment: Review of patient past medical history, allergies, medications, and health status, including review of pertinent consultant reports was performed as part of comprehensive evaluation and provision of care management/care  coordination services.   SDOH (Social Determinants of Health) assessments and interventions performed:  No    Advanced Directives Status: Not addressed in this encounter.  Care Plan  Allergies  Allergen Reactions  . Influenza Virus Vacc Split Pf Other (See Comments)    "deadly sick "  . Penicillins Hives, Itching and Swelling    Outpatient Encounter Medications as of 06/04/2020  Medication Sig  . albuterol (VENTOLIN HFA) 108 (90 Base) MCG/ACT inhaler TAKE 2 PUFFS BY MOUTH EVERY 6 HOURS AS NEEDED FOR WHEEZE OR SHORTNESS OF BREATH  . amLODipine (NORVASC) 10 MG tablet TAKE 1 TABLET BY MOUTH EVERY DAY  . Blood Glucose Monitoring Suppl (ONETOUCH VERIO REFLECT) w/Device KIT Use as Directed to check blood sugars two times a day DX:E11.22  . EPINEPHrine 0.3 mg/0.3 mL IJ SOAJ injection Inject 0.3 mLs (0.3 mg total) into the muscle as needed for anaphylaxis.  . Ferrous Sulfate (IRON) 325 (65 Fe) MG TABS Take by mouth. 1 per day (Patient not taking: Reported on 09/04/2019)  . fluconazole (DIFLUCAN) 100 MG tablet Take 1 tablet (100 mg total) by mouth daily. Take 1 tablet by mouth now repeat in 5 days  . fluticasone furoate-vilanterol (BREO ELLIPTA) 200-25 MCG/INH AEPB Inhale 1 puff into the lungs daily.  [name] glucose blood (ONETOUCH VERIO) test strip Use as Directed to check blood sugars two times a day DX:E11.22  . Magnesium 250 MG TABS Take by mouth. 1 per day  . Multiple Vitamins-Calcium (ONE-A-DAY WOMENS FORMULA) TABS Take 1 tablet by mouth daily. (Patient not taking: Reported on 11/08/2019)  . OneTouch Delica Lancets 78L MISC Use as Directed to check blood sugars  two times a day DX:E11.22  . PARoxetine (PAXIL) 20 MG tablet TAKE 1 TABLET BY MOUTH EVERY DAY  . Tiotropium Bromide Monohydrate (SPIRIVA RESPIMAT) 1.25 MCG/ACT AERS Inhale 2 puffs into the lungs daily.  . Vitamin D, Cholecalciferol, 25 MCG (1000 UT) CAPS Take 1 capsule by mouth daily. (Patient not taking: Reported on 11/08/2019)   No  facility-administered encounter medications on file as of 06/04/2020.    Patient Active Problem List   Diagnosis Date Noted  . Drug reaction 09/05/2019  . Vaccine counseling 09/05/2019  . 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 Chronic rhinitis 09/05/2019  . Moderate persistent asthma without complication 53/00/5110  . 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 Chronic right shoulder pain 04/26/2016  . Hot flashes, menopausal 02/02/2016  . OSA on CPAP 02/02/2016  . Extrinsic asthma, unspecified asthma severity, uncomplicated 21/01/7355  . Fatigue 10/05/2014  . Abnormal EKG 01/12/2011  . Hypertension 01/12/2011  . Healthcare maintenance 01/12/2011    Conditions to be addressed/monitored: HTN and Extrinsic Asthma; Housing barriers   Follow Up Plan: No follow up planned at this time as the patient has switched to a new primary care provider.      Daneen Schick, BSW, CDP Social Worker, Certified Dementia Practitioner [place] [facility] [phone]

## 2020-06-04 NOTE — Chronic Care Management (AMB) (Signed)
Care Management    RN Visit Note  06/03/2020 Name: Karen Molina MRN: [id number] DOB: 1960/11/11  Subjective: Karen Molina is a 60 y.o. year old female who is a primary care patient of Minette Brine, [place]. The care management team was consulted for assistance with disease management and care coordination needs.    Engaged with patient by telephone for follow up visit in response to provider referral for case management and/or care coordination services.   Consent to Services:   Ms. Cumberledge was given information about Care Management services today including:  1. Care Management services includes personalized support from designated clinical staff supervised by her physician, including individualized plan of care and coordination with other care providers 2. 24/7 contact phone numbers for assistance for urgent and routine care needs. 3. The patient may stop case management services at any time by phone call to the office staff.  Patient agreed to services and consent obtained.   Assessment: Review of patient past medical history, allergies, medications, health status, including review of consultants reports, laboratory and other test data, was performed as part of comprehensive evaluation and provision of chronic care management services.   SDOH (Social Determinants of Health) assessments and interventions performed:    Care Plan  Allergies  Allergen Reactions  . Influenza Virus Vacc Split Pf Other (See Comments)    "deadly sick "  . Penicillins Hives, Itching and Swelling    Outpatient Encounter Medications as of 06/03/2020  Medication Sig  . albuterol (VENTOLIN HFA) 108 (90 Base) MCG/ACT inhaler TAKE 2 PUFFS BY MOUTH EVERY 6 HOURS AS NEEDED FOR WHEEZE OR SHORTNESS OF BREATH  . amLODipine (NORVASC) 10 MG tablet TAKE 1 TABLET BY MOUTH EVERY DAY  . Blood Glucose Monitoring Suppl (ONETOUCH VERIO REFLECT) w/Device KIT Use as Directed to check blood sugars two times a day DX:E11.22   . EPINEPHrine 0.3 mg/0.3 mL IJ SOAJ injection Inject 0.3 mLs (0.3 mg total) into the muscle as needed for anaphylaxis.  . Ferrous Sulfate (IRON) 325 (65 Fe) MG TABS Take by mouth. 1 per day (Patient not taking: Reported on 09/04/2019)  . fluconazole (DIFLUCAN) 100 MG tablet Take 1 tablet (100 mg total) by mouth daily. Take 1 tablet by mouth now repeat in 5 days  . fluticasone furoate-vilanterol (BREO ELLIPTA) 200-25 MCG/INH AEPB Inhale 1 puff into the lungs daily.  [name] glucose blood (ONETOUCH VERIO) test strip Use as Directed to check blood sugars two times a day DX:E11.22  . Magnesium 250 MG TABS Take by mouth. 1 per day  . Multiple Vitamins-Calcium (ONE-A-DAY WOMENS FORMULA) TABS Take 1 tablet by mouth daily. (Patient not taking: Reported on 11/08/2019)  . OneTouch Delica Lancets 79K MISC Use as Directed to check blood sugars two times a day DX:E11.22  . PARoxetine (PAXIL) 20 MG tablet TAKE 1 TABLET BY MOUTH EVERY DAY  . Tiotropium Bromide Monohydrate (SPIRIVA RESPIMAT) 1.25 MCG/ACT AERS Inhale 2 puffs into the lungs daily.  . Vitamin D, Cholecalciferol, 25 MCG (1000 UT) CAPS Take 1 capsule by mouth daily. (Patient not taking: Reported on 11/08/2019)   No facility-administered encounter medications on file as of 06/03/2020.    Patient Active Problem List   Diagnosis Date Noted  . Drug reaction 09/05/2019  . Vaccine counseling 09/05/2019  . 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 Chronic rhinitis 09/05/2019  . Moderate persistent asthma without complication 24/11/7351  . 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 Chronic right shoulder pain 04/26/2016  .  Hot flashes, menopausal 02/02/2016  . OSA on CPAP 02/02/2016  . Extrinsic asthma, unspecified asthma severity, uncomplicated 75/88/3254  . Fatigue 10/05/2014  . Abnormal EKG 01/12/2011  . Hypertension 01/12/2011  . Healthcare maintenance 01/12/2011    Conditions to be addressed/monitored: HTN and Asthma, COPD,Prediabetes  Care  Plan : Social Work [place]  Updates made by Lynne Logan, RN since 06/04/2020 12:00 AM  Completed 06/04/2020  Problem: Barriers to Treatment Resolved 06/04/2020    Long-Range Goal: Collaborate with RN Care Manager to assist with care coordination needs related to barriers to treatment Completed 06/03/2020  Start Date: 05/01/2020  Expected End Date: 08/29/2020  Priority: Low  Note:   Current Barriers:  . Chronic disease management support and education needs related to HTN and Asthma   . Financial constraints related to cost of home repair Social Worker Clinical Goal(s):  [name] Over the next 120 days, patient will work with SW to identify and address any acute and/or chronic care coordination needs related to the self health management of HTN and Asthma   . Over the next 120 days, patient will work with SW to address concerns related to home repair needs CCM SW Interventions:  . Inter-disciplinary care team collaboration (see longitudinal plan of care) . Collaboration with Minette Brine, FNP regarding development and update of comprehensive plan of care as evidenced by provider attestation and co-signature . Successful outbound call placed to the patient to assess goal progression . Discussed SW has yet to get a return email from [facility] regarding status of repairs to the patients home . Determined the patient does not have a contact number for the person she has been working with but is able to go to the office in person to request feedback on repair scheduled . Patient reports she will go to the office either Monday or Tuesday to follow up on repair status . Scheduled follow up call over the next 21 days CCM RN Interventions:  06/04/20 successful call completed with patient  . Determined patient has switched her PCP provider to a [facility] Provider . Advised patient she will no longer be eligible for this CCM program and she verbalizes understanding . Notified PCP and embedded  BSW of patient's status change  Patient Goals/Self-Care Activities . Over the next 14 days, patient will:   - Patient will self administer medications as prescribed Patient will attend all scheduled provider appointments Patient will call provider office for new concerns or questions Contact SW as needed prior to next scheduled call Follow up on home repair schedule Follow Up Plan:  SW will follow up with the patient over the next 21 days    Plan: CCM enrollment status changed to "previously enrolled" as per patient request on 06/03/20 to discontinue enrollment due to patient no longer receiving services from this PCP provider. Case closed to case management services in primary care home.   Barb Merino, RN, BSN, CCM Care Management Coordinator [place] [facility]  Direct Phone: [phone]

## 2020-06-20 ENCOUNTER — Other Ambulatory Visit: Payer: Self-pay | Admitting: Nurse Practitioner

## 2020-06-20 DIAGNOSIS — J45909 Unspecified asthma, uncomplicated: Secondary | ICD-10-CM

## 2020-06-24 ENCOUNTER — Institutional Professional Consult (permissible substitution): Payer: BC Managed Care – PPO | Admitting: Neurology

## 2020-06-24 ENCOUNTER — Telehealth: Payer: Self-pay | Admitting: Neurology

## 2020-06-24 NOTE — Telephone Encounter (Signed)
Cancelled sleep consult due to MD being out of office. Pt expressed frustration as she had been waiting on today's appointment. Please call pt to r/s.ang

## 2020-07-01 ENCOUNTER — Ambulatory Visit
Admission: RE | Admit: 2020-07-01 | Discharge: 2020-07-01 | Disposition: A | Discharge status: Home / Self Care | Payer: BC Managed Care – PPO | Source: Ambulatory Visit | Attending: Family Medicine | Admitting: Family Medicine

## 2020-07-01 ENCOUNTER — Inpatient Hospital Stay: Admission: RE | Admit: 2020-07-01 | Payer: BC Managed Care – PPO | Source: Ambulatory Visit

## 2020-07-01 ENCOUNTER — Other Ambulatory Visit: Payer: Self-pay

## 2020-07-01 DIAGNOSIS — Z1231 Encounter for screening mammogram for malignant neoplasm of breast: Secondary | ICD-10-CM

## 2020-07-11 ENCOUNTER — Encounter: Payer: BC Managed Care – PPO | Admitting: Nurse Practitioner

## 2020-07-23 ENCOUNTER — Ambulatory Visit: Payer: BC Managed Care – PPO | Admitting: Neurology

## 2020-07-23 ENCOUNTER — Encounter: Payer: Self-pay | Admitting: Neurology

## 2020-07-23 ENCOUNTER — Other Ambulatory Visit: Payer: Self-pay

## 2020-07-23 VITALS — BP 129/83 | HR 82 | Ht 65.5 in | Wt 190.0 lb

## 2020-07-23 DIAGNOSIS — G4733 Obstructive sleep apnea (adult) (pediatric): Secondary | ICD-10-CM

## 2020-07-23 DIAGNOSIS — Z9989 Dependence on other enabling machines and devices: Secondary | ICD-10-CM

## 2020-07-23 NOTE — Patient Instructions (Signed)
It was good to see you again today.  I am sorry you had trouble getting your CPAP supplies.  I have placed an order for you to get all new supplies through your DME company, [employer].  If you do not hear from them in the next few days, you can always call them or you can call our office.  Please follow-up routinely to see one of our nurse practitioners in 1 year.  He has been compliant with CPAP before and should be able to follow-up yearly.

## 2020-07-23 NOTE — Progress Notes (Signed)
Subjective:    Patient ID: Karen Molina is a 60 y.o. female.  HPI     Interim history:    Karen Molina is a 60 year old right-handed woman with an underlying medical history of asthma, chest pain, cervical cancer, paresthesias, palpitations, and obesity, who presents for follow-up consultation of her obstructive sleep apnea. The patient is unaccompanied today. I last saw her on 11/27/2015, at which time she was compliant with her CPAP of 16 cm.  She subsequently saw Vaughan Browner, NP on 05/25/2016, at which time she was compliant with her CPAP.  She then saw Debbora Presto, NP after a longer gap on 11/09/2018, at which time she was not consistently on her CPAP due to loss of insurance coverage.  She was advised to restart CPAP and was given a prescription for new supplies.  Today, 07/23/2020: I reviewed her CPAP compliance data from 04/15/2020 through 05/15/2020, which is a total of 31 days, during which time she used her machine 12 days with percent use days greater than 4 hours at 32%.  Average AHI for that month was 1/h, leak acceptable with a 95th percentile at 13.2 L/min on a pressure of 16 cm with EPR of 3. She reports that she feels sleepy during the day.  She has not been able to get any recent supplies.  She previously has been consistent with her CPAP therapy and has benefited from it.  She currently has nocturia about 3 times per average night.  She lives alone, she has 2 children, 5 grandchildren and 2 great-grandchildren, she works part-time.  She goes to bed around 10 PM.  She has been using a medium Quatro full facemask from [employer] successfully.  She would like to get back on CPAP consistently.  Her set up date was 10/08/2015.   The patient's allergies, current medications, family history, past medical history, past social history, past surgical history and problem list were reviewed and updated as appropriate.   Previously:     I first met her on 06/17/2015 at the request of her primary care  physician, at which time she reported snoring and excessive daytime somnolence, as well as witnessed breathing pauses while asleep per family. I invited her back for sleep study. She had a baseline sleep study, followed by a CPAP titration study. I went over her test results with her in detail today. Her baseline sleep study from 08/22/2015 showed a sleep efficiency of 78.3% with a latency to sleep of 27 minutes and wake after sleep onset of 59.5 minutes with moderate to severe sleep fragmentation noted. She had an elevated arousal index. She had an increased percentage of stage I and stage II sleep, absence of slow-wave sleep and absence of REM sleep. She had no significant PLMS, EKG or EEG changes. She had snoring and the supine and lateral positions, overall AHI was 28.7 per hour, average oxygen saturation 91%, nadir was 77%. Based on her test results and her sleep-related complaints I invited her back for a full night CPAP titration study. She had this on 09/16/2015. Sleep efficiency was 88.8% with a latency to sleep of 7 minutes and wake after sleep onset of 37 minutes with mild to moderate sleep fragmentation noted. She had an increased percentage of stage II sleep, absence of slow-wave sleep and REM sleep was 15.4% with a mildly prolonged REM latency of 125 minutes. She had no significant PLMS, EKG or EEG changes. Average oxygen saturation was 97%, nadir was 83%. She was titrated on  CPAP from 5 cm to 16 cm. AHI was 0 per hour at the final pressure during non-REM sleep. She had REM sleep on the pressure of 14 cm with the residual AHI of 7.2 per hour. Based on her test results I prescribed CPAP therapy at a pressure of 16 cm via full facemask.   I reviewed her CPAP compliance data from 10/26/2015 through 11/24/2015 which is a total of 30 days, during which time she used her machine 28 days with percent used days greater than 4 hours at 93%, indicating excellent compliance with an average usage for all days of  7 hours and 4 minutes, residual AHI 1 per hour, leak at times high with the 95th percentile at 23.7 L/m on a pressure of 16 cm with EPR of 3.  06/17/2015: She reports snoring and excessive daytime somnolence as well as witnessed breathing pauses while asleep, per family. She lives with her 55 yo son (she has a 71 yo daughter as well).  She works from 8 AM to sometimes 8 PM. She is currently restricted to 8 hours. She has been having some nasal congestion, some drainage lately, feels like she is coming down with the cold.  Typical bedtime is between 8:30 and 9 PM. She does not have trouble going to sleep but has trouble staying asleep and reports multiple nighttime awakenings and nocturia about 4-5 times each night. She denies morning headaches. She had restless leg symptoms in the past but not recently. She has no family history of RLS. Her Epworth sleepiness score is 12 out of 24 today, her fatigue score is 20 out of 63. She quit smoking in 2000, quit smoking marijuana in 2005, drinks alcohol occasionally, in the form of wine on weekends. She does not drink caffeine on a daily basis.   I reviewed your office note from 05/10/2015.  She had PFTs on 11/09/2014 and I reviewed the test results: There was evidence of mild obstruction. She was advised to start Spiriva.   She had asthma as a child, no FHx of OSA.   Her Past Medical History Is Significant For: Past Medical History:  Diagnosis Date  . Asthma   . Cervical cancer ([place])   . Chest pain   . Fatigue   . H/O: hysterectomy   . Insomnia   . Palpitations   . Paresthesias    lower extremities    Her Past Surgical History Is Significant For: Past Surgical History:  Procedure Laterality Date  . ABDOMINAL HYSTERECTOMY    . CERVICAL ABLATION    . INGUINAL HERNIA REPAIR    . UTERINE FIBROID SURGERY      Her Family History Is Significant For: Family History  Problem Relation Age of Onset  . Hypertension Mother   . Diabetes Mother   .  Osteoarthritis Mother   . Stroke Maternal Grandmother   . Diabetes Maternal Grandmother   . Diabetes Brother   . Hypertension Brother   . HIV/AIDS Brother   . Drug abuse Brother   . Lupus Sister   . Arthritis Sister   . Breast cancer Daughter   . Asthma Father   . Healthy Brother   . Healthy Brother   . Healthy Son   . Cancer Paternal Grandfather     Her Social History Is Significant For: Social History   Socioeconomic History  . Marital status: Single    Spouse name: Not on file  . Number of children: 2  . Years of education: College  .  Highest education level: Not on file  Occupational History  . Occupation: [employer]   Tobacco Use  . Smoking status: Former Smoker    Packs/day: 0.50    Years: 20.00    Pack years: 10.00    Types: Cigarettes    Quit date: 03/23/1996    Years since quitting: 24.3  . Smokeless tobacco: Never Used  Vaping Use  . Vaping Use: Never used  Substance and Sexual Activity  . Alcohol use: Yes    Alcohol/week: 5.0 standard drinks    Types: 4 Cans of beer, 1 Glasses of wine per week    Comment: wine on weekends  . Drug use: No    Comment: Quit 03/2003  . Sexual activity: Not on file  Other Topics Concern  . Not on file  Social History Narrative   Diet: Regular        Do you drink/ eat things with caffeine? No      Marital status:    Single                           What year were you married ?      Do you live in a house, apartment,assistred living, condo, trailer, etc.)? House      Is it one or more stories? 2      How many persons live in your home ? 2      Do you have any pets in your home ?(please list) No      Current or past profession: Quality Auditor       Do you exercise? Yes                             Type & how often: Walk      Do you have a living will? No      Do you have a DNR form?  No                     If not, do you want to discuss one? Yes      Do you have signed POA?HPOA forms?  No               If so,  please bring to your        appointment   Denies caffeine use    Social Determinants of Health   Financial Resource Strain: Not on file  Food Insecurity: Not on file  Transportation Needs: Not on file  Physical Activity: Not on file  Stress: Not on file  Social Connections: Not on file    Her Allergies Are:  Allergies  Allergen Reactions  . Influenza Virus Vacc Split Pf Other (See Comments)    "deadly sick "  . Penicillins Hives, Itching and Swelling  :   Her Current Medications Are:  Outpatient Encounter Medications as of 07/23/2020  Medication Sig  . albuterol (VENTOLIN HFA) 108 (90 Base) MCG/ACT inhaler TAKE 2 PUFFS BY MOUTH EVERY 6 HOURS AS NEEDED FOR WHEEZE OR SHORTNESS OF BREATH  . amLODipine (NORVASC) 10 MG tablet TAKE 1 TABLET BY MOUTH EVERY DAY  . BREO ELLIPTA 200-25 MCG/INH AEPB TAKE 1 PUFF BY MOUTH EVERY DAY  . EPINEPHrine 0.3 mg/0.3 mL IJ SOAJ injection Inject 0.3 mLs (0.3 mg total) into the muscle as needed for anaphylaxis.  . Ferrous Sulfate (IRON) 325 (65 Fe) MG TABS  Take by mouth. 1 per day (Patient not taking: Reported on 09/04/2019)  . Magnesium 250 MG TABS Take by mouth. 1 per day  . [DISCONTINUED] Blood Glucose Monitoring Suppl (ONETOUCH VERIO REFLECT) w/Device KIT Use as Directed to check blood sugars two times a day DX:E11.22 (Patient not taking: Reported on 07/23/2020)  . [DISCONTINUED] fluconazole (DIFLUCAN) 100 MG tablet Take 1 tablet (100 mg total) by mouth daily. Take 1 tablet by mouth now repeat in 5 days  . [DISCONTINUED] glucose blood (ONETOUCH VERIO) test strip Use as Directed to check blood sugars two times a day DX:E11.22  . [DISCONTINUED] Multiple Vitamins-Calcium (ONE-A-DAY WOMENS FORMULA) TABS Take 1 tablet by mouth daily. (Patient not taking: Reported on 11/08/2019)  . [DISCONTINUED] OneTouch Delica Lancets 53G MISC Use as Directed to check blood sugars two times a day DX:E11.22  . [DISCONTINUED] PARoxetine (PAXIL) 20 MG tablet TAKE 1 TABLET BY  MOUTH EVERY DAY  . [DISCONTINUED] Tiotropium Bromide Monohydrate (SPIRIVA RESPIMAT) 1.25 MCG/ACT AERS Inhale 2 puffs into the lungs daily.  . [DISCONTINUED] Vitamin D, Cholecalciferol, 25 MCG (1000 UT) CAPS Take 1 capsule by mouth daily. (Patient not taking: Reported on 11/08/2019)   No facility-administered encounter medications on file as of 07/23/2020.  :  Review of Systems:  Out of a complete 14 point review of systems, all are reviewed and negative with the exception of these symptoms as listed below: Review of Systems  Neurological:       Here for supply re order. Last visit was in 2020, Pt has not been using machine recently due to needing supplies. Reports she has not been sleeping well.  Epworth Sleepiness Scale 0= would never doze 1= slight chance of dozing 2= moderate chance of dozing 3= high chance of dozing  Sitting and reading:2 Watching TV:3 Sitting inactive in a public place (ex. Theater or meeting):2 As a passenger in a car for an hour without a break:3 Lying down to rest in the afternoon:3 Sitting and talking to someone:2 Sitting quietly after lunch (no alcohol):3 In a car, while stopped in traffic:0 Total:18     Objective:  Neurological Exam  Physical Exam Physical Examination:   Vitals:   07/23/20 1334  BP: 129/83  Pulse: 82    General Examination: The patient is a very pleasant 60 y.o. female in no acute distress. She appears well-developed and well-nourished and well groomed.   HEENT: Normocephalic, atraumatic, pupils are equal, round and reactive to light, extraocular tracking is preserved, face is symmetric, normal facial animation, hearing grossly intact.  Neck is supple with full range of motion, no carotid bruits.  Airway examination reveals mild to moderate dryness, moderate airway crowding. Tongue protrudes centrally and palate elevates symmetrically.   Chest: Clear to auscultation without wheezing, rhonchi or crackles noted.  Heart:  S1+S2+0, regular and normal without murmurs, rubs or gallops noted.   Abdomen: Soft, non-tender and non-distended.  Extremities: There is no pitting edema in the distal lower extremities bilaterally.  Skin: Warm and dry without trophic changes noted.   Musculoskeletal: exam reveals no obvious joint deformities, tenderness or joint swelling.   Neurologically:  Mental status: The patient is awake, alert and oriented in all 4 spheres. Her immediate and remote memory, attention, language skills and fund of knowledge are appropriate. There is no evidence of aphasia, agnosia, apraxia or anomia. Speech is clear with normal prosody and enunciation. Thought process is linear. Mood is normal and affect is normal.  Cranial nerves II - XII are as  described above under HEENT exam.  Motor exam: Normal bulk, strength and tone is noted. There is no tremor. Fine motor skills and coordination: grossly intact.  Cerebellar testing: No dysmetria or intention tremor. There is no truncal or gait ataxia.  Sensory exam: intact to light touch in the upper and lower extremities.  Gait, station and balance: She stands easily. No veering to one side is noted. No leaning to one side is noted. Posture is age-appropriate and stance is narrow based. Gait shows normal stride length and normal pace. No problems turning are noted.   Assessment and plan:   In summary, Karen Molina is a very pleasant 60 year old female with an underlying medical history of asthma, chest pain, cervical cancer, paresthesias, palpitations, and obesity, who presents for follow-up consultation of her obstructive sleep apnea.  Her baseline sleep study from 08/22/2015 showed an AHI of 28.7/h, O2 nadir 77%.  She had a subsequent CPAP titration study on 09/16/2015.  She has been compliant with CPAP therapy but had a lapse in treatment secondary to loss of insurance coverage in the meantime.  She needs new supplies.  She is advised to get back on CPAP  therapy as quickly as she can, I wrote for new supplies for her current machine.  She should be eligible for a new machine after July of this year.  Her current machine seems to be working well enough for her to get back on it for now.  She has previously been very compliant with treatment and has benefited from treatment.  She is advised to follow-up routinely in 1 year, sooner if needed.  She can see one of our nurse practitioners at the time.  I answered all her questions today and she was in agreement with the plan.  We can revisit the possibility of getting her new equipment at the next appointment.  We may have to do another test such as a home sleep test in order to qualify her for new equipment, we will pick up our discussion at the next appointment.   I spent 32 minutes in total face-to-face time and in reviewing records during pre-charting, more than 50% of which was spent in counseling and coordination of care, reviewing test results, reviewing medications and treatment regimen and/or in discussing or reviewing the diagnosis of OSA, the prognosis and treatment options. Pertinent laboratory and imaging test results that were available during this visit with the patient were reviewed by me and considered in my medical decision making (see chart for details).

## 2020-07-29 ENCOUNTER — Other Ambulatory Visit: Payer: Self-pay

## 2020-07-29 ENCOUNTER — Ambulatory Visit (AMBULATORY_SURGERY_CENTER): Payer: Self-pay | Admitting: *Deleted

## 2020-07-29 VITALS — Ht 66.0 in | Wt 192.0 lb

## 2020-07-29 DIAGNOSIS — Z1211 Encounter for screening for malignant neoplasm of colon: Secondary | ICD-10-CM

## 2020-07-29 MED ORDER — CLENPIQ 10-3.5-12 MG-GM -GM/160ML PO SOLN: 1.0000 | ORAL | 0 refills | Status: DC

## 2020-07-29 NOTE — Progress Notes (Signed)
No egg or soy allergy known to patient  No issues with past sedation with any surgeries or procedures- with Fibroid surgery woke up during procedure due to longer procedure - she stated her heart stopped, she had lots of tubes  and she had to have transfusion of blood- pt is VERY anxious about colon and sedation - crying in PV- I tried to ease her worries in PV today- encouraged to call with questions  Patient denies ever being told they had issues or difficulty with intubation  No FH of Malignant Hyperthermia No diet pills per patient No home 02 use per patient  No blood thinners per patient  Pt denies issues with constipation  No A fib or A flutter  EMMI video to pt or via [place] 19 guidelines implemented in PV today with Pt and RN  Pt is fully vaccinated  for [employer] given to pt in PV today , Code to Pharmacy and  NO PA's for preps discussed with pt In PV today  Discussed with pt there will be an out-of-pocket cost for prep and that varies from $0 to 70 dollars   Due to the COVID-19 pandemic we are asking patients to follow certain guidelines.  Pt aware of COVID protocols and [facility] guidelines

## 2020-08-12 ENCOUNTER — Encounter: Payer: Self-pay | Admitting: Gastroenterology

## 2020-08-12 ENCOUNTER — Other Ambulatory Visit: Payer: Self-pay

## 2020-08-12 ENCOUNTER — Ambulatory Visit (AMBULATORY_SURGERY_CENTER): Payer: BC Managed Care – PPO | Admitting: Gastroenterology

## 2020-08-12 VITALS — BP 130/78 | HR 68 | Temp 97.3°F | Resp 13 | Ht 65.0 in | Wt 192.0 lb

## 2020-08-12 DIAGNOSIS — Z1211 Encounter for screening for malignant neoplasm of colon: Secondary | ICD-10-CM | POA: Diagnosis present

## 2020-08-12 MED ORDER — SODIUM CHLORIDE 0.9 % IV SOLN: 500.0000 mL | INTRAVENOUS | Status: DC

## 2020-08-12 NOTE — Op Note (Signed)
[facility] Patient Name: Karen Molina Procedure Date: 08/12/2020 11:17 AM MRN: [id number] Endoscopist: Jackquline Denmark , MD Age: 60 Referring MD:  Date of Birth: [birth year] Gender: Female Account #: [id number] Procedure:                Colonoscopy Indications:              Screening for colorectal malignant neoplasm Medicines:                Monitored Anesthesia Care Procedure:                Pre-Anesthesia Assessment:                           - Prior to the procedure, a History and Physical                            was performed, and patient medications and                            allergies were reviewed. The patient's tolerance of                            previous anesthesia was also reviewed. The risks                            and benefits of the procedure and the sedation                            options and risks were discussed with the patient.                            All questions were answered, and informed consent                            was obtained. Prior Anticoagulants: The patient has                            taken no previous anticoagulant or antiplatelet                            agents. ASA Grade Assessment: II - A patient with                            mild systemic disease. After reviewing the risks                            and benefits, the patient was deemed in                            satisfactory condition to undergo the procedure.                           After obtaining informed consent, the colonoscope  was passed under direct vision. Throughout the                            procedure, the patient's blood pressure, pulse, and                            oxygen saturations were monitored continuously. The                            Olympus PFC-[id number] (#[id number]) Colonoscope was                            introduced through the anus and advanced to the the                            cecum, identified  by appendiceal orifice and                            ileocecal valve. The colonoscopy was performed                            without difficulty. The patient tolerated the                            procedure well. The quality of the bowel                            preparation was good. The ileocecal valve,                            appendiceal orifice, and rectum were photographed. Scope In: 11:23:33 AM Scope Out: 11:34:59 AM Scope Withdrawal Time: 0 hours 9 minutes 2 seconds  Total Procedure Duration: 0 hours 11 minutes 26 seconds  Findings:                 A few rare small-mouthed diverticula were found in                            the sigmoid colon.                           Non-bleeding internal hemorrhoids were found during                            retroflexion. The hemorrhoids were small. 1 Channel                            of grade 4 internal hemorrhoids.                           The exam was otherwise without abnormality on                            direct and retroflexion views. Complications:            No  immediate complications. Estimated Blood Loss:     Estimated blood loss: none. Impression:               - Minimal sigmoid diverticulosis.                           - Non-bleeding internal hemorrhoids.                           - The examination was otherwise normal on direct                            and retroflexion views.                           - No specimens collected. Recommendation:           - Patient has a contact number available for                            emergencies. The signs and symptoms of potential                            delayed complications were discussed with the                            patient. Return to normal activities tomorrow.                            Written discharge instructions were provided to the                            patient.                           - Resume previous diet.                           - Continue  present medications.                           - Repeat colonoscopy in 10 years for screening                            purposes. Earlier, if clinically indicated or                            change in family history.                           - Use Preparation H 1 twice daily as needed x 7 to                            10 days.                           - Return to GI clinic PRN.                           -  The findings and recommendations were discussed                            with the designated responsible adult. Lynann Bologna, MD 08/12/2020 11:38:56 AM This report has been signed electronically.

## 2020-08-12 NOTE — Patient Instructions (Signed)
YOU HAD AN ENDOSCOPIC PROCEDURE TODAY AT THE [facility]:   Refer to the procedure report that was given to you for any specific questions about what was found during the examination.  If the procedure report does not answer your questions, please call your gastroenterologist to clarify.  If you requested that your care partner not be given the details of your procedure findings, then the procedure report has been included in a sealed envelope for you to review at your convenience later.  YOU SHOULD EXPECT: Some feelings of bloating in the abdomen. Passage of more gas than usual.  Walking can help get rid of the air that was put into your GI tract during the procedure and reduce the bloating. If you had a lower endoscopy (such as a colonoscopy or flexible sigmoidoscopy) you may notice spotting of blood in your stool or on the toilet paper. If you underwent a bowel prep for your procedure, you may not have a normal bowel movement for a few days.  Please Note:  You might notice some irritation and congestion in your nose or some drainage.  This is from the oxygen used during your procedure.  There is no need for concern and it should clear up in a day or so.  SYMPTOMS TO REPORT IMMEDIATELY:   Following lower endoscopy (colonoscopy or flexible sigmoidoscopy):  Excessive amounts of blood in the stool  Significant tenderness or worsening of abdominal pains  Swelling of the abdomen that is new, acute  Fever of 100F or higher   Following upper endoscopy (EGD)  Vomiting of blood or coffee ground material  New chest pain or pain under the shoulder blades  Painful or persistently difficult swallowing  New shortness of breath  Fever of 100F or higher  Black, tarry-looking stools  For urgent or emergent issues, a gastroenterologist can be reached at any hour by calling [phone]. Do not use MyChart messaging for urgent concerns.    DIET:  We do recommend a small meal at first, but  then you may proceed to your regular diet.  Drink plenty of fluids but you should avoid alcoholic beverages for 24 hours.  ACTIVITY:  You should plan to take it easy for the rest of today and you should NOT DRIVE or use heavy machinery until tomorrow (because of the sedation medicines used during the test).    FOLLOW UP: Our staff will call the number listed on your records 48-72 hours following your procedure to check on you and address any questions or concerns that you may have regarding the information given to you following your procedure. If we do not reach you, we will leave a message.  We will attempt to reach you two times.  During this call, we will ask if you have developed any symptoms of COVID 19. If you develop any symptoms (ie: fever, flu-like symptoms, shortness of breath, cough etc.) before then, please call [phone].  If you test positive for Covid 19 in the 2 weeks post procedure, please call and report this information to us.    If any biopsies were taken you will be contacted by phone or by letter within the next 1-3 weeks.  Please call us at [phone] if you have not heard about the biopsies in 3 weeks.    SIGNATURES/CONFIDENTIALITY: You and/or your care partner have signed paperwork which will be entered into your electronic medical record.  These signatures attest to the fact that that the information above on   your After Visit Summary has been reviewed and is understood.  Full responsibility of the confidentiality of this discharge information lies with you and/or your care-partner. 

## 2020-08-12 NOTE — Progress Notes (Signed)
Report given to PACU, vss 

## 2020-08-12 NOTE — Progress Notes (Signed)
Pt's states no medical or surgical changes since previsit or office visit. 

## 2020-08-14 ENCOUNTER — Telehealth: Payer: Self-pay

## 2020-08-14 NOTE — Telephone Encounter (Signed)
  Follow up Call-  Call back number 08/12/2020  Post procedure Call Back phone  # [phone]  Permission to leave phone message Yes  Some recent data might be hidden     Patient questions:  Do you have a fever, pain , or abdominal swelling? No. Pain Score  0 *  Have you tolerated food without any problems? Yes.    Have you been able to return to your normal activities? Yes.    Do you have any questions about your discharge instructions: Diet   No. Medications  No. Follow up visit  No.  Do you have questions or concerns about your Care? No.  Actions: * If pain score is 4 or above: No action needed, pain <4.  1. Have you developed a fever since your procedure? no  2.   Have you had an respiratory symptoms (SOB or cough) since your procedure? no  3.   Have you tested positive for COVID 19 since your procedure no  4.   Have you had any family members/close contacts diagnosed with the COVID 19 since your procedure?  no   If yes to any of these questions please route to Joylene John, RN and Joella Prince, RN

## 2020-10-06 IMAGING — MG DIGITAL SCREENING BILAT W/ TOMO W/ CAD
8 series · 8 of 24 positions shown · non-contrast
Comparison: Previous exam(s).

CLINICAL DATA: Screening.

EXAM:
DIGITAL SCREENING BILATERAL MAMMOGRAM WITH TOMO AND CAD

[R MLO synth-2D]
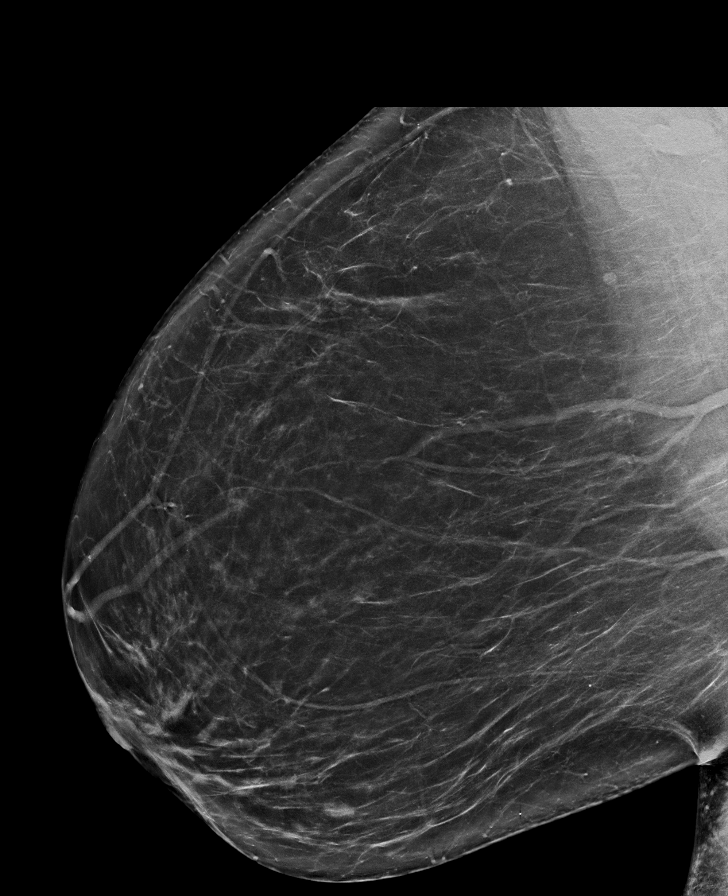

[L CC synth-2D]
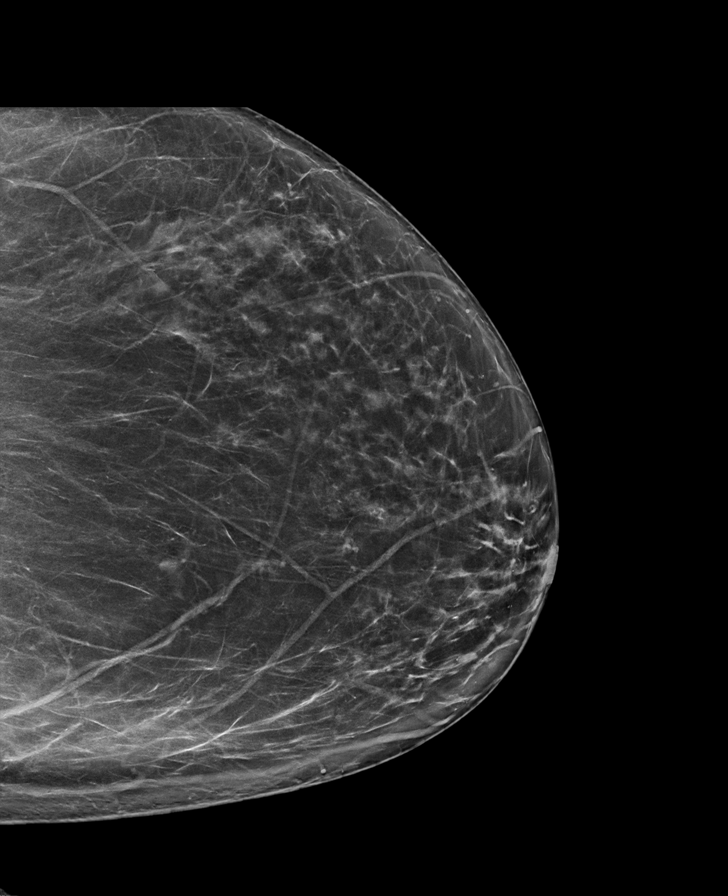

[R CC synth-2D]
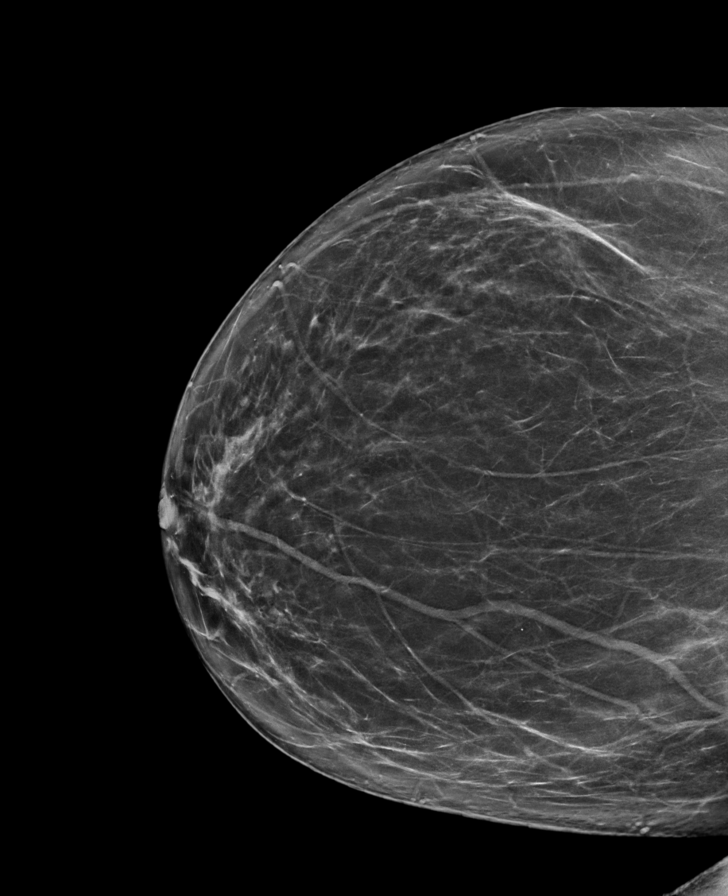

[L MLO synth-2D]
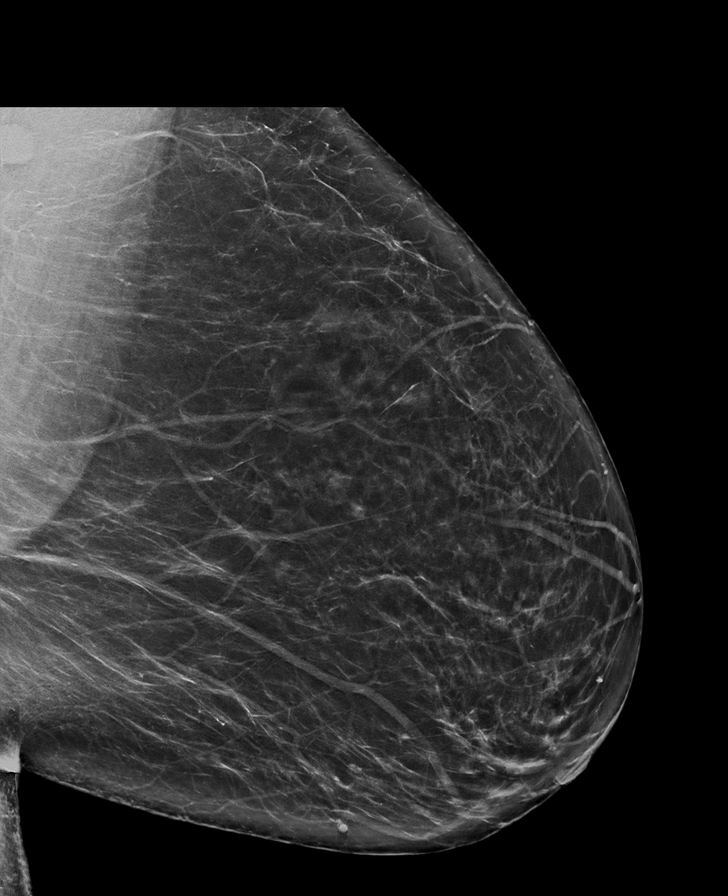

[R MLO tomo · tomo slice 43/84.0]
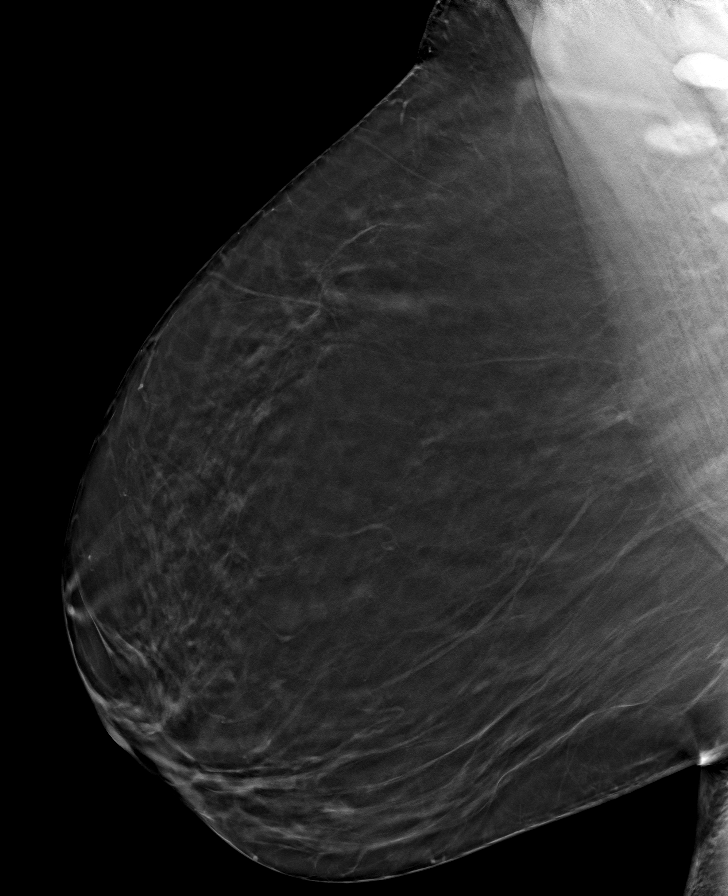

[L MLO tomo · tomo slice 41/82.0]
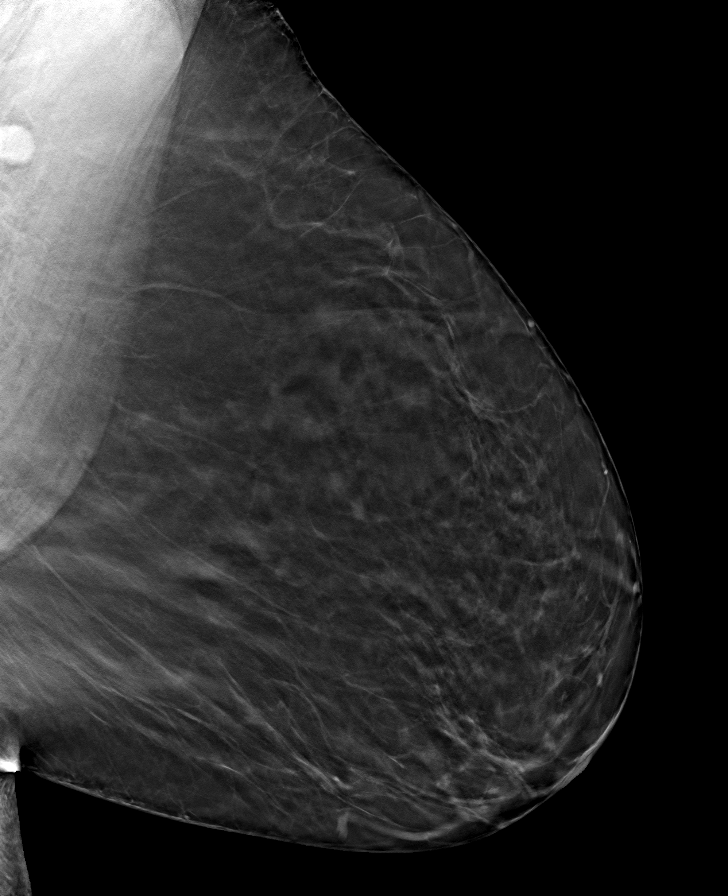

[R CC tomo · tomo slice 40/79.0]
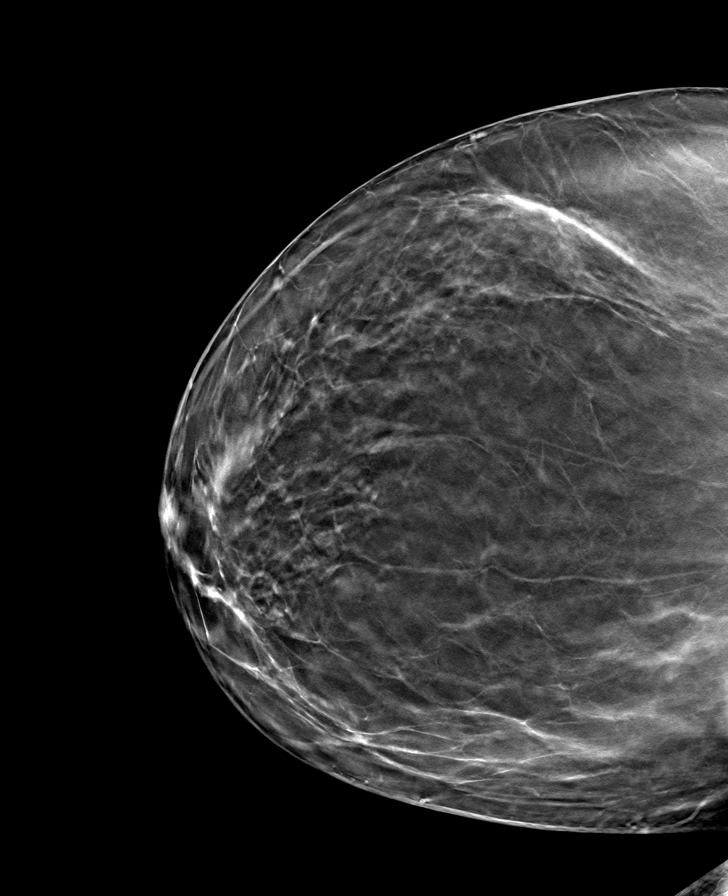

[L CC tomo · tomo slice 40/79.0]
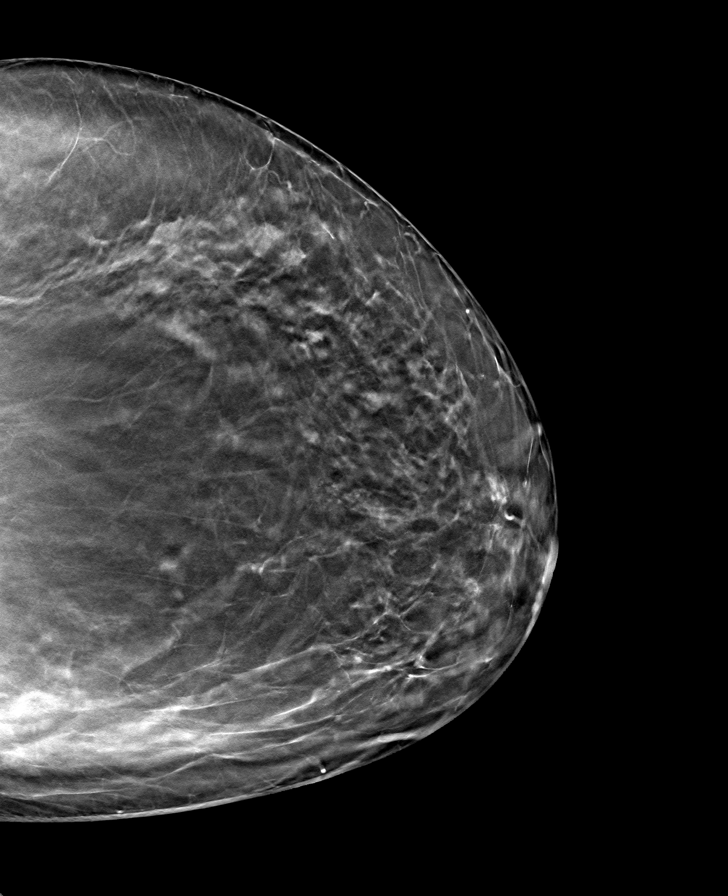

[8 of 24 positions shown; findings below may reference images not displayed]

ACR Breast Density Category b: There are scattered areas of
fibroglandular density.
FINDINGS: There are no findings suspicious for malignancy. Images were
processed with CAD.
IMPRESSION: No mammographic evidence of malignancy. A result letter of this
screening mammogram will be mailed directly to the patient.

RECOMMENDATION:
Screening mammogram in one year. (Code:[id number])

BI-RADS CATEGORY  1: Negative.

## 2020-11-25 ENCOUNTER — Other Ambulatory Visit: Payer: Self-pay | Admitting: Nurse Practitioner

## 2020-11-25 DIAGNOSIS — I10 Essential (primary) hypertension: Secondary | ICD-10-CM

## 2021-07-07 ENCOUNTER — Other Ambulatory Visit: Payer: Self-pay | Admitting: Family Medicine

## 2021-07-07 DIAGNOSIS — Z1231 Encounter for screening mammogram for malignant neoplasm of breast: Secondary | ICD-10-CM

## 2021-07-21 ENCOUNTER — Ambulatory Visit
Admission: RE | Admit: 2021-07-21 | Discharge: 2021-07-21 | Disposition: A | Discharge status: Home / Self Care | Payer: BC Managed Care – PPO | Source: Ambulatory Visit | Attending: Family Medicine | Admitting: Family Medicine

## 2021-07-21 DIAGNOSIS — Z1231 Encounter for screening mammogram for malignant neoplasm of breast: Secondary | ICD-10-CM

## 2021-07-28 ENCOUNTER — Ambulatory Visit: Payer: BC Managed Care – PPO | Admitting: Family Medicine

## 2021-09-20 ENCOUNTER — Other Ambulatory Visit: Payer: Self-pay

## 2021-09-20 ENCOUNTER — Encounter (HOSPITAL_COMMUNITY): Payer: Self-pay | Admitting: *Deleted

## 2021-09-20 ENCOUNTER — Emergency Department (HOSPITAL_COMMUNITY)
Admission: EM | Admit: 2021-09-20 | Discharge: 2021-09-21 | Discharge status: Against Medical Advice | Payer: BC Managed Care – PPO | Attending: Emergency Medicine | Admitting: Emergency Medicine

## 2021-09-20 DIAGNOSIS — S01511A Laceration without foreign body of lip, initial encounter: Secondary | ICD-10-CM | POA: Diagnosis not present

## 2021-09-20 DIAGNOSIS — W230XXA Caught, crushed, jammed, or pinched between moving objects, initial encounter: Secondary | ICD-10-CM | POA: Insufficient documentation

## 2021-09-20 DIAGNOSIS — S00501A Unspecified superficial injury of lip, initial encounter: Secondary | ICD-10-CM | POA: Diagnosis present

## 2021-09-20 DIAGNOSIS — Z5321 Procedure and treatment not carried out due to patient leaving prior to being seen by health care provider: Secondary | ICD-10-CM | POA: Insufficient documentation

## 2021-09-20 NOTE — ED Notes (Signed)
Pt called 3x no repsonse

## 2021-09-20 NOTE — ED Triage Notes (Signed)
The pt was styruck by a car door approx 45 minutes ago  she has a llaceration to her upper lip and she has a loose tooth upper central    bleeding controlled at present

## 2021-09-21 ENCOUNTER — Ambulatory Visit (HOSPITAL_COMMUNITY)
Admission: EM | Admit: 2021-09-21 | Discharge: 2021-09-21 | Disposition: A | Discharge status: Home / Self Care | Payer: BC Managed Care – PPO | Attending: Family Medicine | Admitting: Family Medicine

## 2021-09-21 ENCOUNTER — Encounter (HOSPITAL_COMMUNITY): Payer: Self-pay | Admitting: *Deleted

## 2021-09-21 ENCOUNTER — Other Ambulatory Visit: Payer: Self-pay

## 2021-09-21 DIAGNOSIS — S00511A Abrasion of lip, initial encounter: Secondary | ICD-10-CM | POA: Diagnosis not present

## 2021-09-21 NOTE — ED Provider Notes (Signed)
[facility]    CSN: [id number] Arrival date & time: 09/21/21  1000      History   Chief Complaint Chief Complaint  Patient presents with   Laceration    HPI Karen Molina is a 62 y.o. female.    Laceration  Here for a laceration or cut to her upper lip.  Last evening when she was shutting the car door after getting groceries out the corner of the car door caught her upper lip and caused a wound.  Last Tdap found in the immunization tab in epic was August 2020.  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   Chronic right shoulder pain 04/26/2016   Hot flashes, menopausal 02/02/2016   OSA on CPAP 02/02/2016   Extrinsic asthma, unspecified asthma severity, uncomplicated 26/20/3559   Fatigue 10/05/2014   Abnormal EKG 01/12/2011   Hypertension 01/12/2011   Healthcare maintenance 01/12/2011    Past Surgical History:  Procedure Laterality Date   ABDOMINAL HYSTERECTOMY     CERVICAL ABLATION     INGUINAL HERNIA REPAIR     UTERINE FIBROID SURGERY      OB History     Gravida  3   Para  3   Term      Preterm      AB      Living  2      SAB      IAB      Ectopic      Multiple      Live Births  3            Home Medications    Prior to Admission medications   Medication Sig Start Date End Date Taking? Authorizing Provider   acetaminophen (TYLENOL) 650 MG CR tablet Take by mouth.    [provider]  albuterol (VENTOLIN HFA) 108 (90 Base) MCG/ACT inhaler TAKE 2 PUFFS BY MOUTH EVERY 6 HOURS AS NEEDED FOR WHEEZE OR SHORTNESS OF BREATH 05/02/19   Minette Brine, FNP  ALPRAZolam Duanne Moron) 0.5 MG tablet Take 0.5 mg by mouth at bedtime as needed. 06/19/20   [provider]  amLODipine (NORVASC) 10 MG tablet TAKE 1 TABLET BY MOUTH EVERY DAY 05/06/20   Minette Brine, FNP  BREO ELLIPTA 200-25 MCG/INH AEPB TAKE 1 PUFF BY MOUTH EVERY DAY 06/20/20   Minette Brine, FNP  EPINEPHrine 0.3 mg/0.3 mL IJ SOAJ injection Inject 0.3 mLs (0.3 mg total) into the muscle as needed for anaphylaxis. 09/04/19   Garnet Sierras, DO  Ferrous Sulfate (IRON) 325 (65 Fe) MG TABS Take by mouth. 1 per day    [provider]  ibuprofen (ADVIL) 200 MG tablet Take 200 mg by mouth every 6 (six) hours as needed.  [provider]  Magnesium 250 MG TABS Take by mouth. 1 per day    [provider]    Family History Family History  Problem Relation Age of Onset   Hypertension Mother    Diabetes Mother    Osteoarthritis Mother    Asthma Father    Lupus Sister    Arthritis Sister    Breast cancer Daughter        17s   Stroke Maternal Grandmother    Diabetes Maternal Grandmother    Cancer Paternal Grandfather    Esophageal cancer Paternal Grandfather    Diabetes Brother    Hypertension Brother    HIV/AIDS Brother    Drug abuse Brother    Healthy Brother    Healthy Brother    Healthy Son    Colon cancer Neg Hx    Colon polyps Neg Hx    Rectal cancer Neg Hx    Stomach cancer Neg Hx     Social History Social History   Tobacco Use   Smoking status: Former    Packs/day: 0.50    Years: 20.00    Total pack years: 10.00    Types: Cigarettes    Quit date: 03/23/1996    Years since quitting: 25.5   Smokeless tobacco: Never  Vaping Use   Vaping Use: Never used  Substance Use Topics   Alcohol use: Yes     Alcohol/week: 5.0 standard drinks of alcohol    Types: 1 Glasses of wine, 4 Cans of beer per week    Comment: wine on weekends   Drug use: No    Comment: Quit 03/2003     Allergies   Influenza virus vacc split pf and Penicillins   Review of Systems Review of Systems   Physical Exam Triage Vital Signs ED Triage Vitals  Enc Vitals Group     BP 09/21/21 1022 (!) 143/80     Pulse Rate 09/21/21 1022 68     Resp 09/21/21 1022 20     Temp 09/21/21 1022 98.2 F (36.8 C)     Temp src --      SpO2 09/21/21 1022 98 %     Weight --      Height --      Head Circumference --      Peak Flow --      Pain Score 09/21/21 1021 0     Pain Loc --      Pain Edu? --      Excl. in [place]? --    No data found.  Updated Vital Signs BP (!) 143/80   Pulse 68   Temp 98.2 F (36.8 C)   Resp 20   SpO2 98%   Visual Acuity Right Eye Distance:   Left Eye Distance:   Bilateral Distance:    Right Eye Near:   Left Eye Near:    Bilateral Near:     Physical Exam Vitals reviewed.  Constitutional:      General: She is not in acute distress.    Appearance: She is not ill-appearing, toxic-appearing or diaphoretic.  HENT:     Mouth/Throat:     Mouth: Mucous membranes are moist.     Comments: On her upper lip just to the right of midline there is a wound it is very shallow and linear on the upper end and then the bottom is more of a slightly deep abrasion.  The abrasion part is round and about 2 mm in diameter.  It is not bleeding, but there is a little bit of swelling around the area. Skin:    Coloration: Skin is not jaundiced or pale.  Neurological:     Mental Status: She is alert and oriented to person, place, and time.  Psychiatric:        Behavior: Behavior normal.      UC Treatments / Results  Labs (all labs ordered are listed, but only abnormal results are displayed) Labs Reviewed - No data to display  EKG   Radiology No results found.  Procedures Procedures (including  critical care time)  Medications Ordered in UC Medications - No data to display  Initial Impression / Assessment and Plan / UC Course  I have reviewed the triage vital signs and the nursing notes.  Pertinent labs & imaging results that were available during my care of the patient were reviewed by me and considered in my medical decision making (see chart for details).     I discussed with her that I do not think suturing would have a superior result to letting it heal on its own.  Also she is past the 12-hour window that I prefer for sewing up anything facial.  Wound care is explained, and she is up-to-date on her tetanus Final Clinical Impressions(s) / UC Diagnoses   Final diagnoses:  Abrasion of lip, initial encounter     Discharge Instructions      Your last tetanus (TdaP) booster was August of 2020.  Clean the wound 2 times daily with warm soapy water or hydrogen peroxide.  Then apply new antibiotic ointment.  You can apply the antibiotic ointment more often if you want.  I would ice the area in the next 24 hours.  Also in the next 2 or 3 days, sleep with your head elevated some.  You may want to drink with a straw for the next 4 to 5 days, while this is healing     ED Prescriptions   None    PDMP not reviewed this encounter.   Barrett Henle, MD 09/21/21 1051

## 2021-09-21 NOTE — Discharge Instructions (Addendum)
Your last tetanus (TdaP) booster was August of 2020.  Clean the wound 2 times daily with warm soapy water or hydrogen peroxide.  Then apply new antibiotic ointment.  You can apply the antibiotic ointment more often if you want.  I would ice the area in the next 24 hours.  Also in the next 2 or 3 days, sleep with your head elevated some.  You may want to drink with a straw for the next 4 to 5 days, while this is healing

## 2021-09-21 NOTE — ED Triage Notes (Signed)
Pt reports she hit her top lip on a car door last night. Pt was at ED but left before being seen.

## 2022-02-05 ENCOUNTER — Other Ambulatory Visit: Payer: Self-pay | Admitting: Nurse Practitioner

## 2022-02-05 DIAGNOSIS — I10 Essential (primary) hypertension: Secondary | ICD-10-CM

## 2022-07-28 ENCOUNTER — Other Ambulatory Visit: Payer: Self-pay | Admitting: Family Medicine

## 2022-07-28 DIAGNOSIS — Z1231 Encounter for screening mammogram for malignant neoplasm of breast: Secondary | ICD-10-CM

## 2022-07-29 ENCOUNTER — Ambulatory Visit
Admission: RE | Admit: 2022-07-29 | Discharge: 2022-07-29 | Disposition: A | Discharge status: Home / Self Care | Payer: BC Managed Care – PPO | Source: Ambulatory Visit | Attending: Family Medicine | Admitting: Family Medicine

## 2022-07-29 DIAGNOSIS — Z1231 Encounter for screening mammogram for malignant neoplasm of breast: Secondary | ICD-10-CM

## 2023-10-11 ENCOUNTER — Other Ambulatory Visit: Payer: Self-pay | Admitting: Family Medicine

## 2023-10-11 DIAGNOSIS — Z1231 Encounter for screening mammogram for malignant neoplasm of breast: Secondary | ICD-10-CM

## 2023-10-26 ENCOUNTER — Ambulatory Visit: Payer: Self-pay

## 2023-11-04 ENCOUNTER — Ambulatory Visit
Admission: RE | Admit: 2023-11-04 | Discharge: 2023-11-04 | Disposition: A | Discharge status: Home / Self Care | Payer: Self-pay | Source: Ambulatory Visit | Attending: Family Medicine | Admitting: Family Medicine

## 2023-11-04 DIAGNOSIS — Z1231 Encounter for screening mammogram for malignant neoplasm of breast: Secondary | ICD-10-CM
